# Patient Record
Sex: Male | Born: 1985 | Race: Black or African American | Hispanic: No | Marital: Single | Smoking: Current some day smoker
Health system: Southern US, Community
[De-identification: ages and names within clinical notes are randomized; demographics above are authoritative.]

## PROBLEM LIST (undated history)

## (undated) DIAGNOSIS — IMO0001 Reserved for inherently not codable concepts without codable children: Secondary | ICD-10-CM

## (undated) DIAGNOSIS — I10 Essential (primary) hypertension: Secondary | ICD-10-CM

## (undated) HISTORY — PX: ABDOMINAL SURGERY: SHX537

---
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
## 2004-04-19 ENCOUNTER — Emergency Department (HOSPITAL_COMMUNITY): Admission: EM | Admit: 2004-04-19 | Discharge: 2004-04-19 | Payer: Self-pay | Admitting: Emergency Medicine

## 2006-08-13 ENCOUNTER — Emergency Department (HOSPITAL_COMMUNITY): Admission: EM | Admit: 2006-08-13 | Discharge: 2006-08-13 | Payer: Self-pay | Admitting: Family Medicine

## 2008-07-27 ENCOUNTER — Emergency Department (HOSPITAL_COMMUNITY): Admission: EM | Admit: 2008-07-27 | Discharge: 2008-07-27 | Payer: Self-pay | Admitting: Family Medicine

## 2010-06-08 ENCOUNTER — Emergency Department (HOSPITAL_COMMUNITY)
Admission: EM | Admit: 2010-06-08 | Discharge: 2010-06-08 | Disposition: A | Discharge status: Home / Self Care | Payer: Self-pay | Attending: Emergency Medicine | Admitting: Emergency Medicine

## 2010-06-08 DIAGNOSIS — M545 Low back pain, unspecified: Secondary | ICD-10-CM | POA: Insufficient documentation

## 2010-07-07 ENCOUNTER — Emergency Department (HOSPITAL_COMMUNITY)
Admission: EM | Admit: 2010-07-07 | Discharge: 2010-07-08 | Disposition: A | Discharge status: Home / Self Care | Payer: Self-pay | Attending: Emergency Medicine | Admitting: Emergency Medicine

## 2010-07-07 DIAGNOSIS — J351 Hypertrophy of tonsils: Secondary | ICD-10-CM | POA: Insufficient documentation

## 2010-07-07 DIAGNOSIS — J029 Acute pharyngitis, unspecified: Secondary | ICD-10-CM | POA: Insufficient documentation

## 2010-07-07 LAB — RAPID STREP SCREEN (MED CTR MEBANE ONLY): Streptococcus, Group A Screen (Direct): NEGATIVE

## 2010-08-31 NOTE — Op Note (Signed)
. [facility]  Patient:    Hunter Koch, Hunter Koch Visit Number: [id number] MRN: [id number]          Service Type: DSU Location: [facility] Attending Physician:  Milly Jakob Proc. Date: 01/14/01 Admit Date:  01/14/2001                             Operative Report  PREOPERATIVE DIAGNOSIS:  Fracture of scaphoid.  POSTOPERATIVE DIAGNOSIS:  Fracture of scaphoid.  PROCEDURE:  Reduction and fixation of scaphoid fracture with a 20 mm 3 standard Accutrak screw.  SURGEON:  Harvie Junior, M.D.  ASSISTANT:  Currie Paris. Thedore Mins.  ANESTHESIA:  General anesthesia.  INDICATIONS:  This is a 25 year old male with a long history of having a fracture of the scaphoid which happened about three weeks ago.  He ultimately presented complaining of wrist pain.  X-ray showed an obvious fracture.  An MRI was obtained at the familys request, ultimately to sort out whether this could possibly be an old fibrous union or cystic change and turned out to be an acute fracture of the scaphoid.  The patient was desirous of being able to play football, but ultimately felt that the pain would be best controlled with internal fixation.  He was brought to the operating room for this procedure.  DESCRIPTION OF PROCEDURE:  The patient was brought to the operating room and after adequate anesthesia was obtained with general anesthesia, the patient was placed on the operating table.  The right arm was then prepped and draped in the usual sterile fashion.  Following this, the arm was pronated and flexed. The cortical ring was identified and a guide wire was advanced across the midportion of the scaphoid in both the AP and lateral.  The guide wire advanced out through the trapezium and was held in place while the imaging was taken, AP, lateral, obliques to show that the guide wire was perpendicular to the fracture site and the articular margin of the scaphoid.  At this point, the screw  was measured and a 20 mm Accutrak screw was chosen.  The fracture was reamed to a level of 22.5 mm and the 20 screw was advanced across the fracture site and buried within the scaphoid.  Imaging was used throughout this to ensure that the screw was in fact the correct length and it was.  The fracture gap did appear to close down and following placement of the screw under fluoroscopic imaging.  The wound was then copiously irrigated and suctioned dry.  The skin was closed with two interrupted nylon sutures.  A sterile and compressive dressing was applied and a thumb spica cast.  The patient was then taken to the recovery room where he was noted to be in satisfactory condition.  Estimated blood loss for the procedure was none. Attending Physician:  Milly Jakob DD:  01/14/01 TD:  01/14/01 Job: [id number]

## 2010-11-12 ENCOUNTER — Emergency Department (HOSPITAL_COMMUNITY): Payer: Self-pay

## 2010-11-12 ENCOUNTER — Emergency Department (HOSPITAL_COMMUNITY)
Admission: EM | Admit: 2010-11-12 | Discharge: 2010-11-12 | Disposition: A | Discharge status: Home / Self Care | Payer: Self-pay | Attending: Emergency Medicine | Admitting: Emergency Medicine

## 2010-11-12 DIAGNOSIS — S8990XA Unspecified injury of unspecified lower leg, initial encounter: Secondary | ICD-10-CM | POA: Insufficient documentation

## 2010-11-12 DIAGNOSIS — Y92009 Unspecified place in unspecified non-institutional (private) residence as the place of occurrence of the external cause: Secondary | ICD-10-CM | POA: Insufficient documentation

## 2010-11-12 DIAGNOSIS — S99929A Unspecified injury of unspecified foot, initial encounter: Secondary | ICD-10-CM | POA: Insufficient documentation

## 2010-11-12 DIAGNOSIS — M79609 Pain in unspecified limb: Secondary | ICD-10-CM | POA: Insufficient documentation

## 2010-11-12 DIAGNOSIS — S9030XA Contusion of unspecified foot, initial encounter: Secondary | ICD-10-CM | POA: Insufficient documentation

## 2010-11-12 DIAGNOSIS — IMO0002 Reserved for concepts with insufficient information to code with codable children: Secondary | ICD-10-CM | POA: Insufficient documentation

## 2010-11-12 DIAGNOSIS — M25579 Pain in unspecified ankle and joints of unspecified foot: Secondary | ICD-10-CM | POA: Insufficient documentation

## 2011-01-10 ENCOUNTER — Emergency Department (HOSPITAL_COMMUNITY)
Admission: EM | Admit: 2011-01-10 | Discharge: 2011-01-10 | Disposition: A | Discharge status: Home / Self Care | Payer: Self-pay | Attending: Emergency Medicine | Admitting: Emergency Medicine

## 2011-01-10 DIAGNOSIS — IMO0001 Reserved for inherently not codable concepts without codable children: Secondary | ICD-10-CM | POA: Insufficient documentation

## 2011-01-10 DIAGNOSIS — L0231 Cutaneous abscess of buttock: Secondary | ICD-10-CM | POA: Insufficient documentation

## 2011-01-10 DIAGNOSIS — L03317 Cellulitis of buttock: Secondary | ICD-10-CM | POA: Insufficient documentation

## 2011-03-04 ENCOUNTER — Emergency Department (HOSPITAL_COMMUNITY)
Admission: EM | Admit: 2011-03-04 | Discharge: 2011-03-04 | Disposition: A | Discharge status: Home / Self Care | Payer: Self-pay | Attending: Emergency Medicine | Admitting: Emergency Medicine

## 2011-03-04 ENCOUNTER — Encounter: Payer: Self-pay | Admitting: *Deleted

## 2011-03-04 DIAGNOSIS — M5380 Other specified dorsopathies, site unspecified: Secondary | ICD-10-CM | POA: Insufficient documentation

## 2011-03-04 DIAGNOSIS — M545 Low back pain, unspecified: Secondary | ICD-10-CM | POA: Insufficient documentation

## 2011-03-04 DIAGNOSIS — M62830 Muscle spasm of back: Secondary | ICD-10-CM

## 2011-03-04 DIAGNOSIS — R1031 Right lower quadrant pain: Secondary | ICD-10-CM | POA: Insufficient documentation

## 2011-03-04 MED ORDER — METHOCARBAMOL 500 MG PO TABS: 500.0000 mg | ORAL_TABLET | Freq: Two times a day (BID) | ORAL | Status: AC | PRN

## 2011-03-04 MED ORDER — KETOROLAC TROMETHAMINE 60 MG/2ML IM SOLN
60.0000 mg | Freq: Once | INTRAMUSCULAR | Status: AC
  Administered 2011-03-04: 60 mg via INTRAMUSCULAR
  Filled 2011-03-04: qty 2

## 2011-03-04 MED ORDER — NAPROXEN 500 MG PO TABS: 500.0000 mg | ORAL_TABLET | Freq: Two times a day (BID) | ORAL | Status: DC

## 2011-03-04 MED ORDER — OXYCODONE-ACETAMINOPHEN 5-325 MG PO TABS
2.0000 | ORAL_TABLET | Freq: Once | ORAL | Status: AC
  Administered 2011-03-04: 2 via ORAL
  Filled 2011-03-04: qty 2

## 2011-03-04 MED ORDER — OXYCODONE-ACETAMINOPHEN 5-325 MG PO TABS: 1.0000 | ORAL_TABLET | ORAL | Status: AC | PRN

## 2011-03-04 NOTE — ED Provider Notes (Signed)
History     CSN: [id number] Arrival date & time: 03/04/2011  1:08 AM   First MD Initiated Contact with Patient 03/04/11 0109      Chief Complaint  Patient presents with  . Back Pain    muscle spasms    (Consider location/radiation/quality/duration/timing/severity/associated sxs/prior treatment) HPI Comments: Acute onset of lower back pain while getting out of bed. No history of urinary symptoms and no red flags for pathologic back pain  Patient is a 25 y.o. male presenting with back pain. The history is provided by the patient.  Back Pain  This is a new problem. The current episode started 1 to 2 hours ago. The problem occurs constantly. The problem has not changed since onset.Associated with: Getting out of bed. Pain location: Right lower back  The quality of the pain is described as aching and cramping. Radiates to: Right lower abdomen. The pain is at a severity of 10/10. The pain is severe. The symptoms are aggravated by bending, twisting and certain positions. Pertinent negatives include no chest pain, no numbness, no headaches, no bowel incontinence, no perianal numbness, no bladder incontinence, no dysuria, no leg pain, no paresthesias, no paresis, no tingling and no weakness. He has tried nothing for the symptoms.    History reviewed. No pertinent past medical history.  Past Surgical History  Procedure Date  . Abdominal surgery     Family History  Problem Relation Age of Onset  . Hypertension Mother   . Diabetes Father     History  Substance Use Topics  . Smoking status: Current Everyday Smoker  . Smokeless tobacco: Not on file  . Alcohol Use: Yes     occaisional      Review of Systems  Cardiovascular: Negative for chest pain.  Gastrointestinal: Negative for bowel incontinence.  Genitourinary: Negative for bladder incontinence and dysuria.  Musculoskeletal: Positive for back pain.  Neurological: Negative for tingling, weakness, numbness, headaches and  paresthesias.    Allergies  Review of patient's allergies indicates no known allergies.  Home Medications   Current Outpatient Rx  Name Route Sig Dispense Refill  . METHOCARBAMOL 500 MG PO TABS Oral Take 1 tablet (500 mg total) by mouth 2 (two) times daily as needed. 20 tablet 0  . NAPROXEN 500 MG PO TABS Oral Take 1 tablet (500 mg total) by mouth 2 (two) times daily. 30 tablet 0  . OXYCODONE-ACETAMINOPHEN 5-325 MG PO TABS Oral Take 1 tablet by mouth every 4 (four) hours as needed for pain. May take 2 tablets PO q 6 hours for severe pain - Do not take with Tylenol as this tablet already contains tylenol 15 tablet 0    BP 147/88  Pulse 78  Temp 98.3 F (36.8 C)  Resp 20  SpO2 100%  Physical Exam  Constitutional: He appears well-developed and well-nourished.       Uncomfortable appearing  HENT:  Head: Normocephalic and atraumatic.  Eyes: Conjunctivae are normal. No scleral icterus.  Cardiovascular: Normal rate, regular rhythm and normal heart sounds.   Pulmonary/Chest: Effort normal and breath sounds normal.  Abdominal: Soft. Bowel sounds are normal. He exhibits no distension. There is tenderness ( Tenderness to palpation with deep paracolic gutter palpation). There is no rebound and no guarding.       Abdomen abdomen is slightly palpated there is no tenderness  Musculoskeletal: Normal range of motion. He exhibits tenderness ( No tenderness to palpation of the back including paraspinal muscles and spinal bones.). He exhibits no edema.  Tenderness with psoas stretching  Neurological: He is alert. Coordination normal.       Normal sensation and reflexes of the bilateral lower extremities at the patellar tendon  Skin: Skin is warm and dry. No rash noted.    ED Course  Procedures (including critical care time)  Labs Reviewed - No data to display No results found.   1. Muscle spasm of back       MDM  Patient is able to walk but gait is hindered by back pain. Given  the reproducible nature of his pain with stretching of his toe as an iliopsoas muscles, suspect this is muscle spasm. There is no spinal tenderness and no focal neurologic deficits to suspect spinal cord pathology. There are no urinary symptoms or fevers to suggest urinary pathology. There is no CVA tenderness. Patient has been given Toradol intramuscular and Percocet in the emergency department with some improvement.        Vida Roller, MD 03/04/11 [phone]

## 2011-03-04 NOTE — ED Notes (Signed)
C/o back pain & muscle spasms, onset tonight, pt straight back to Room 9, Dr. Hyacinth Meeker into room during triage process. Alert, NAD, calm, interactive, skin W&D, resps e/u. also mentions diarrhea (loose & watery), also R leg numb, back worse with leg movement. Tender to RLQ. Denies other sx.

## 2011-03-04 NOTE — ED Notes (Signed)
EDP at [facility], pt seen, assessed prior to RN assessment, see MD notes, pending orders.

## 2011-03-20 ENCOUNTER — Encounter (HOSPITAL_COMMUNITY): Payer: Self-pay | Admitting: Emergency Medicine

## 2011-03-20 ENCOUNTER — Emergency Department (HOSPITAL_COMMUNITY)
Admission: EM | Admit: 2011-03-20 | Discharge: 2011-03-20 | Disposition: A | Discharge status: Home / Self Care | Payer: Self-pay | Attending: Emergency Medicine | Admitting: Emergency Medicine

## 2011-03-20 DIAGNOSIS — K089 Disorder of teeth and supporting structures, unspecified: Secondary | ICD-10-CM | POA: Insufficient documentation

## 2011-03-20 DIAGNOSIS — R220 Localized swelling, mass and lump, head: Secondary | ICD-10-CM | POA: Insufficient documentation

## 2011-03-20 DIAGNOSIS — K029 Dental caries, unspecified: Secondary | ICD-10-CM | POA: Insufficient documentation

## 2011-03-20 DIAGNOSIS — R110 Nausea: Secondary | ICD-10-CM | POA: Insufficient documentation

## 2011-03-20 DIAGNOSIS — K0889 Other specified disorders of teeth and supporting structures: Secondary | ICD-10-CM

## 2011-03-20 MED ORDER — PENICILLIN V POTASSIUM 500 MG PO TABS: 500.0000 mg | ORAL_TABLET | Freq: Four times a day (QID) | ORAL | Status: AC

## 2011-03-20 MED ORDER — NAPROXEN 500 MG PO TABS: 500.0000 mg | ORAL_TABLET | Freq: Two times a day (BID) | ORAL | Status: DC

## 2011-03-20 MED ORDER — OXYCODONE-ACETAMINOPHEN 5-325 MG PO TABS: 1.0000 | ORAL_TABLET | ORAL | Status: AC | PRN

## 2011-03-20 MED ORDER — KETOROLAC TROMETHAMINE 60 MG/2ML IM SOLN
60.0000 mg | Freq: Once | INTRAMUSCULAR | Status: AC
  Administered 2011-03-20: 60 mg via INTRAMUSCULAR
  Filled 2011-03-20: qty 2

## 2011-03-20 NOTE — ED Notes (Signed)
PT. REPORTS RIGHT LOWER MOLAR PAIN ONSET LAST NIGHT UNRELIEVED BY OTC PAIN MEDICATIONS.

## 2011-03-20 NOTE — ED Notes (Signed)
Pt states that the bottom right side tooth is hurting pt states no denist. Pt has no absess but a cavity in the tooth that hurts. Pt painful to touch.

## 2011-03-20 NOTE — ED Provider Notes (Signed)
History     CSN: [id number] Arrival date & time: 03/20/2011  6:33 AM   First MD Initiated Contact with Patient 03/20/11 [phone]      Chief Complaint  Patient presents with  . Dental Pain    (Consider location/radiation/quality/duration/timing/severity/associated sxs/prior treatment) HPI Comments: Right lower first molar pain, onset last night, associated with mild swelling and nausea. Symptoms are constant, worse with chewing. Denies acute injury but has history of cavity and said tooth. Has seen dentists in the past but not recently and has not been seen for this complaint prior to this visit. No pain medications prior to arrival  Patient is a 25 y.o. male presenting with tooth pain. The history is provided by the patient.  Dental PainPrimary symptoms do not include fever or sore throat.  Additional symptoms do not include: facial swelling and trouble swallowing.    History reviewed. No pertinent past medical history.  Past Surgical History  Procedure Date  . Abdominal surgery     Family History  Problem Relation Age of Onset  . Hypertension Mother   . Diabetes Father     History  Substance Use Topics  . Smoking status: Current Everyday Smoker  . Smokeless tobacco: Not on file  . Alcohol Use: Yes     occaisional      Review of Systems  Constitutional: Negative for fever and chills.  HENT: Positive for dental problem. Negative for sore throat, facial swelling, trouble swallowing and voice change.        Toothache  Gastrointestinal: Positive for nausea. Negative for vomiting.    Allergies  Review of patient's allergies indicates no known allergies.  Home Medications   Current Outpatient Rx  Name Route Sig Dispense Refill  . NAPROXEN 500 MG PO TABS Oral Take 1 tablet (500 mg total) by mouth 2 (two) times daily. 30 tablet 0  . NAPROXEN 500 MG PO TABS Oral Take 1 tablet (500 mg total) by mouth 2 (two) times daily with a meal. 30 tablet 0  . OXYCODONE-ACETAMINOPHEN  5-325 MG PO TABS Oral Take 1 tablet by mouth every 4 (four) hours as needed for pain. May take 2 tablets PO q 6 hours for severe pain - Do not take with Tylenol as this tablet already contains tylenol 15 tablet 0  . PENICILLIN V POTASSIUM 500 MG PO TABS Oral Take 1 tablet (500 mg total) by mouth 4 (four) times daily. 40 tablet 0    BP 120/80  Pulse 117  Temp(Src) 99.3 F (37.4 C) (Oral)  Resp 18  SpO2 92%  Physical Exam  Nursing note and vitals reviewed. Constitutional: He appears well-developed and well-nourished. No distress.  HENT:  Head: Normocephalic and atraumatic.  Mouth/Throat: Oropharynx is clear and moist. No oropharyngeal exudate.       Dental Disease - has deep caries in right first lower molar, no surrounding abscess is, no oral cavity swelling and no pain or swelling under the tongue in the sublingual area.  Eyes: Conjunctivae are normal. No scleral icterus.  Neck: Normal range of motion. Neck supple. No thyromegaly present.       No submental or sublingual or anterior cervical lymphadenopathy  Cardiovascular: Normal rate and regular rhythm.   Pulmonary/Chest: Effort normal and breath sounds normal.  Lymphadenopathy:    He has no cervical adenopathy.  Neurological: He is alert.  Skin: Skin is warm and dry. No rash noted. He is not diaphoretic.    ED Course  Procedures (including critical care time)  Labs Reviewed - No data to display No results found.   1. Toothache       MDM  Tenderness and pain over tooth, and deep cavity consistent with likely periapical abscess, antibiotics, pain medications for home. Dental followup suggested and followup information given on discharge paperwork. Patient aware of need to call today for appointment       Vida Roller, MD 03/20/11 [phone]

## 2011-09-17 ENCOUNTER — Encounter (HOSPITAL_COMMUNITY): Payer: Self-pay | Admitting: Emergency Medicine

## 2011-09-17 ENCOUNTER — Emergency Department (HOSPITAL_COMMUNITY)
Admission: EM | Admit: 2011-09-17 | Discharge: 2011-09-17 | Disposition: A | Discharge status: Home / Self Care | Payer: Self-pay | Attending: Emergency Medicine | Admitting: Emergency Medicine

## 2011-09-17 DIAGNOSIS — R197 Diarrhea, unspecified: Secondary | ICD-10-CM | POA: Insufficient documentation

## 2011-09-17 DIAGNOSIS — IMO0001 Reserved for inherently not codable concepts without codable children: Secondary | ICD-10-CM | POA: Insufficient documentation

## 2011-09-17 DIAGNOSIS — R112 Nausea with vomiting, unspecified: Secondary | ICD-10-CM | POA: Insufficient documentation

## 2011-09-17 DIAGNOSIS — R109 Unspecified abdominal pain: Secondary | ICD-10-CM | POA: Insufficient documentation

## 2011-09-17 DIAGNOSIS — R198 Other specified symptoms and signs involving the digestive system and abdomen: Secondary | ICD-10-CM | POA: Insufficient documentation

## 2011-09-17 HISTORY — DX: Reserved for inherently not codable concepts without codable children: IMO0001

## 2011-09-17 LAB — COMPREHENSIVE METABOLIC PANEL
ALT: 14 U/L (ref 0–53)
BUN: 9 mg/dL (ref 6–23)
CO2: 22 mEq/L (ref 19–32)
Calcium: 9.7 mg/dL (ref 8.4–10.5)
GFR calc Af Amer: >90 mL/min (ref >90)
GFR calc non Af Amer: >90 mL/min (ref >90)
Glucose, Bld: 121 mg/dL — ABNORMAL HIGH (ref 70–99)
Sodium: 139 mEq/L (ref 135–145)
Total Protein: 7.8 g/dL (ref 6.0–8.3)

## 2011-09-17 LAB — POCT I-STAT, CHEM 8
Calcium, Ion: 1.15 mmol/L (ref 1.12–1.32)
Chloride: 104 mEq/L (ref 96–112)
HCT: 48 % (ref 39.0–52.0)
Sodium: 142 mEq/L (ref 135–145)
TCO2: 22 mmol/L (ref 0–100)

## 2011-09-17 LAB — CBC
HCT: 43 % (ref 39.0–52.0)
Hemoglobin: 14.6 g/dL (ref 13.0–17.0)
MCH: 28.5 pg (ref 26.0–34.0)
MCHC: 34 g/dL (ref 30.0–36.0)
MCV: 83.8 fL (ref 78.0–100.0)
RBC: 5.13 MIL/uL (ref 4.22–5.81)

## 2011-09-17 MED ORDER — ONDANSETRON HCL 4 MG/2ML IJ SOLN
4.0000 mg | Freq: Once | INTRAMUSCULAR | Status: AC
  Administered 2011-09-17: 4 mg via INTRAVENOUS
  Filled 2011-09-17: qty 2

## 2011-09-17 MED ORDER — PROMETHAZINE HCL 25 MG PO TABS: 25.0000 mg | ORAL_TABLET | Freq: Four times a day (QID) | ORAL | Status: DC | PRN

## 2011-09-17 MED ORDER — SODIUM CHLORIDE 0.9 % IV BOLUS (SEPSIS)
1000.0000 mL | Freq: Once | INTRAVENOUS | Status: AC
  Administered 2011-09-17: 1000 mL via INTRAVENOUS

## 2011-09-17 MED ORDER — HYDROMORPHONE HCL PF 1 MG/ML IJ SOLN
1.0000 mg | Freq: Once | INTRAMUSCULAR | Status: AC
  Administered 2011-09-17: 1 mg via INTRAVENOUS
  Filled 2011-09-17: qty 1

## 2011-09-17 NOTE — ED Provider Notes (Signed)
History     CSN: [id number]  Arrival date & time 09/17/11  0204   First MD Initiated Contact with Patient 09/17/11 0226      Chief Complaint  Patient presents with  . Emesis  . Diarrhea    (Consider location/radiation/quality/duration/timing/severity/associated sxs/prior treatment) HPI History provided by patient. Lower abdominal cramping started tonight. Patient states he ate breakfast at [employer] and had an egg sandwich. He was belching all day long, and that it tasted like sour eggs. Then tonight developed symptoms. Having cramping and tenesmus. No stools. Nausea all day and vomiting on arrival to the ED. On off chills but no recorded fevers. No known sick contacts. No travel. No recent antibiotics. No difficulty urinating. Moderate in severity. No medical problems or history of similar symptoms. Past Medical History  Diagnosis Date  . No significant past medical history     Past Surgical History  Procedure Date  . Abdominal surgery     Family History  Problem Relation Age of Onset  . Hypertension Mother   . Diabetes Father     History  Substance Use Topics  . Smoking status: Current Everyday Smoker  . Smokeless tobacco: Not on file  . Alcohol Use: Yes     occaisional      Review of Systems  Constitutional: Negative for fever and chills.  HENT: Negative for neck pain and neck stiffness.   Eyes: Negative for pain.  Respiratory: Negative for shortness of breath.   Cardiovascular: Negative for chest pain.  Gastrointestinal: Positive for nausea and vomiting. Negative for constipation, blood in stool and abdominal distention.  Genitourinary: Negative for dysuria.  Musculoskeletal: Negative for back pain.  Skin: Negative for rash.  Neurological: Negative for headaches.  All other systems reviewed and are negative.    Allergies  Review of patient's allergies indicates no known allergies.  Home Medications  No current outpatient prescriptions on file.  BP  140/122  Pulse 74  Resp 18  SpO2 98%  Physical Exam  Constitutional: He is oriented to person, place, and time. He appears well-developed and well-nourished.  HENT:  Head: Normocephalic and atraumatic.       Mildly dry mucous membranes  Eyes: Conjunctivae and EOM are normal. Pupils are equal, round, and reactive to light.  Neck: Trachea normal. Neck supple. No thyromegaly present.  Cardiovascular: Normal rate, regular rhythm, S1 normal, S2 normal and normal pulses.     No systolic murmur is present   No diastolic murmur is present  Pulses:      Radial pulses are 2+ on the right side, and 2+ on the left side.  Pulmonary/Chest: Effort normal and breath sounds normal. He has no wheezes. He has no rhonchi. He has no rales. He exhibits no tenderness.  Abdominal: Soft. Normal appearance. There is no CVA tenderness and negative Murphy's sign.       Hyperactive bowel sounds without tenderness. Soft throughout localizes discomfort to left lower quadrant region.  Musculoskeletal:       BLE:s Calves nontender, no cords or erythema, negative Homans sign  Neurological: He is alert and oriented to person, place, and time. He has normal strength. No cranial nerve deficit or sensory deficit. GCS eye subscore is 4. GCS verbal subscore is 5. GCS motor subscore is 6.  Skin: Skin is warm and dry. No rash noted. He is not diaphoretic.  Psychiatric: His speech is normal.       Cooperative and appropriate    ED Course  Procedures (including  critical care time)  IV fluids. Zofran and Dilaudid. Serial exams without tenderness or peritonitis.  Results for orders placed during the hospital encounter of 09/17/11  CBC      Component Value Range   WBC 14.7 (*) 4.0 - 10.5 (K/uL)   RBC 5.13  4.22 - 5.81 (MIL/uL)   Hemoglobin 14.6  13.0 - 17.0 (g/dL)   HCT 95.2  84.1 - 32.4 (%)   MCV 83.8  78.0 - 100.0 (fL)   MCH 28.5  26.0 - 34.0 (pg)   MCHC 34.0  30.0 - 36.0 (g/dL)   RDW 40.1  02.7 - 25.3 (%)    Platelets 253  150 - 400 (K/uL)  COMPREHENSIVE METABOLIC PANEL      Component Value Range   Sodium 139  135 - 145 (mEq/L)   Potassium 3.4 (*) 3.5 - 5.1 (mEq/L)   Chloride 103  96 - 112 (mEq/L)   CO2 22  19 - 32 (mEq/L)   Glucose, Bld 121 (*) 70 - 99 (mg/dL)   BUN 9  6 - 23 (mg/dL)   Creatinine, Ser 6.64  0.50 - 1.35 (mg/dL)   Calcium 9.7  8.4 - 40.3 (mg/dL)   Total Protein 7.8  6.0 - 8.3 (g/dL)   Albumin 4.4  3.5 - 5.2 (g/dL)   AST 15  0 - 37 (U/L)   ALT 14  0 - 53 (U/L)   Alkaline Phosphatase 61  39 - 117 (U/L)   Total Bilirubin 0.8  0.3 - 1.2 (mg/dL)   GFR calc non Af Amer >90  >90 (mL/min)   GFR calc Af Amer >90  >90 (mL/min)  POCT I-STAT, CHEM 8      Component Value Range   Sodium 142  135 - 145 (mEq/L)   Potassium 3.3 (*) 3.5 - 5.1 (mEq/L)   Chloride 104  96 - 112 (mEq/L)   BUN 8  6 - 23 (mg/dL)   Creatinine, Ser 4.74  0.50 - 1.35 (mg/dL)   Glucose, Bld 259 (*) 70 - 99 (mg/dL)   Calcium, Ion 5.63  8.75 - 1.32 (mmol/L)   TCO2 22  0 - 100 (mmol/L)   Hemoglobin 16.3  13.0 - 17.0 (g/dL)   HCT 64.3  32.9 - 51.8 (%)   Labs obtained and reviewed as above. Recheck at 5:10 AM is feeling much better and requesting to be discharged home. Repeat ABD exam unchanged.    MDM   Nausea and vomiting with lower abdominal cramping and workup as above. Symptoms significantly improved and patient requesting discharge home. No acute abdomen or indication for CT scan at this time. Reliable historian verbalizes understanding abdominal pain precautions and agrees to all discharge and followup instructions.        Sunnie Nielsen, MD 09/17/11 [phone]

## 2011-09-17 NOTE — ED Notes (Signed)
Pt denies any pain or questions upon discharge, verbalizes understanding of medications given with no driving and prescription medications with f/u instructions.

## 2011-09-17 NOTE — ED Notes (Signed)
Attempted to establish iv for fluids ordered by MD. Plan of care was updated and pt refused iv, state "no i don't do needles, i just want something to drink". MD will be made aware and to follow up, will continue to monitor pt.

## 2011-09-17 NOTE — ED Notes (Signed)
Patient complaining of lower abdominal pain, nausea, diarrhea, fever, chills, and dizziness that started today.  8 episodes of emesis in last 24 hours.

## 2011-09-17 NOTE — Discharge Instructions (Signed)
Clear Liquid Diet The clear liquid dietconsists of foods that are liquid or will become liquid at room temperature.You should be able to see through the liquid and beverages. Examples of foods allowed on a clear liquid diet include fruit juice, broth or bouillon, gelatin, or frozen ice pops. The purpose of this diet is to provide necessary fluid, electrolytes such as sodium and potassium, and energy to keep the body functioning during times when you are not able to consume a regular diet.A clear liquid diet should not be continued for long periods of time as it is not nutritionally adequate.  REASONS FOR USING A CLEAR LIQUID DIET  In sudden onset (acute) conditions for a patient before or after surgery.   As the first step in oral feeding.   For fluid and electrolyte replacement in diarrheal diseases.   As a diet before certain medical tests are performed.  ADEQUACY The clear liquid diet is adequate only in ascorbic acid, according to the Recommended Dietary Allowances of the National Research Council. CHOOSING FOODS Breads and Starches  Allowed:  None are allowed.   Avoid: All are avoided.  Vegetables  Allowed:  Strained tomato or vegetable juice.   Avoid: Any others.  Fruit  Allowed:  Strained fruit juices and fruit drinks. Include 1 serving of citrus or vitamin C-enriched fruit juice daily.   Avoid: Any others.  Meat and Meat Substitutes  Allowed:  None are allowed.   Avoid: All are avoided.  Milk  Allowed:  None are allowed.   Avoid: All are avoided.  Soups and Combination Foods  Allowed:  Clear bouillon, broth, or strained broth-based soups.   Avoid: Any others.  Desserts and Sweets  Allowed:  Sugar, honey. High protein gelatin. Flavored gelatin, ices, or frozen ice pops that do not contain milk.   Avoid: Any others.  Fats and Oils  Allowed:  None are allowed.   Avoid: All are avoided.  Beverages  Allowed: Cereal beverages, coffee (regular or  decaffeinated), tea, or soda at the discretion of your caregiver.   Avoid: Any others.  Condiments  Allowed:  Iodized salt.   Avoid: Any others, including pepper.  Supplements  Allowed:  Liquid nutrition beverages.   Avoid: Any others that contain lactose or fiber.  SAMPLE MEAL PLAN Breakfast  4 oz (120 mL) strained orange juice.    to 1 cup (125 to 250 mL) gelatin (plain or fortified).   1 cup (250 mL) beverage (coffee or tea).   Sugar, if desired.  Midmorning Snack   cup (125 mL) gelatin (plain or fortified).  Lunch  1 cup (250 mL) broth or consomm.   4 oz (120 mL) strained grapefruit juice.    cup (125 mL) gelatin (plain or fortified).   1 cup (250 mL) beverage (coffee or tea).   Sugar, if desired.  Midafternoon Snack   cup (125 mL) fruit ice.    cup (125 mL) strained fruit juice.  Dinner  1 cup (250 mL) broth or consomm.    cup (125 mL) cranberry juice.    cup (125 mL) flavored gelatin (plain or fortified).   1 cup (250 mL) beverage (coffee or tea).   Sugar, if desired.  Evening Snack  4 oz (120 mL) strained apple juice (vitamin C-fortified).    cup (125 mL) flavored gelatin (plain or fortified).  Document Released: 04/01/2005 Document Revised: 03/21/2011 Document Reviewed: 06/29/2010 [employer] Patient Information 2012 [employer]. 

## 2011-09-17 NOTE — ED Notes (Signed)
MD at bedside for pt evaluation

## 2012-03-10 ENCOUNTER — Other Ambulatory Visit: Payer: Self-pay | Admitting: Occupational Medicine

## 2012-03-10 ENCOUNTER — Ambulatory Visit: Payer: Self-pay

## 2012-03-10 DIAGNOSIS — Z0000 Encounter for general adult medical examination without abnormal findings: Secondary | ICD-10-CM

## 2013-01-07 ENCOUNTER — Encounter (HOSPITAL_COMMUNITY): Payer: Self-pay | Admitting: *Deleted

## 2013-01-07 ENCOUNTER — Emergency Department (HOSPITAL_COMMUNITY)
Admission: EM | Admit: 2013-01-07 | Discharge: 2013-01-07 | Disposition: A | Discharge status: Home / Self Care | Payer: BC Managed Care – PPO | Attending: Emergency Medicine | Admitting: Emergency Medicine

## 2013-01-07 DIAGNOSIS — Z77098 Contact with and (suspected) exposure to other hazardous, chiefly nonmedicinal, chemicals: Secondary | ICD-10-CM | POA: Insufficient documentation

## 2013-01-07 DIAGNOSIS — T2692XA Corrosion of left eye and adnexa, part unspecified, initial encounter: Secondary | ICD-10-CM

## 2013-01-07 DIAGNOSIS — H5789 Other specified disorders of eye and adnexa: Secondary | ICD-10-CM | POA: Insufficient documentation

## 2013-01-07 DIAGNOSIS — H53149 Visual discomfort, unspecified: Secondary | ICD-10-CM | POA: Insufficient documentation

## 2013-01-07 DIAGNOSIS — F17200 Nicotine dependence, unspecified, uncomplicated: Secondary | ICD-10-CM | POA: Insufficient documentation

## 2013-01-07 MED ORDER — TETRACAINE HCL 0.5 % OP SOLN
2.0000 [drp] | Freq: Once | OPHTHALMIC | Status: AC
  Administered 2013-01-07: 2 [drp] via OPHTHALMIC
  Filled 2013-01-07: qty 2

## 2013-01-07 MED ORDER — TOBRAMYCIN 0.3 % OP SOLN: 1.0000 [drp] | OPHTHALMIC | Status: DC

## 2013-01-07 MED ORDER — FLUORESCEIN SODIUM 1 MG OP STRP
1.0000 | ORAL_STRIP | Freq: Once | OPHTHALMIC | Status: AC
  Administered 2013-01-07: 1 via OPHTHALMIC

## 2013-01-07 MED ORDER — FLUORESCEIN SODIUM 1 MG OP STRP
1.0000 | ORAL_STRIP | Freq: Once | OPHTHALMIC | Status: DC
  Filled 2013-01-07: qty 1

## 2013-01-07 MED ORDER — OXYCODONE-ACETAMINOPHEN 5-325 MG PO TABS
2.0000 | ORAL_TABLET | Freq: Once | ORAL | Status: AC
  Administered 2013-01-07: 2 via ORAL
  Filled 2013-01-07: qty 2

## 2013-01-07 NOTE — ED Notes (Signed)
Pt works as a Forensic scientist. Pt was removing a hose and the built up pressure was significant and HTZ entered the patient's Lt eye. Chemical is liquid but crystallizes when it dries. Pt reports that he was wearing safety googles and he did attempt to flush eye PTA
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
## 2013-01-07 NOTE — ED Notes (Signed)
Eye Irrigation completed, pt tolerated procedure well--- pt denies eye pain at this time.

## 2013-01-07 NOTE — ED Provider Notes (Signed)
Medical screening examination/treatment/procedure(s) were performed by non-physician practitioner and as supervising physician I was immediately available for consultation/collaboration.  Juquan Reznick, MD 01/07/13 0618 

## 2013-07-01 ENCOUNTER — Encounter (HOSPITAL_COMMUNITY): Payer: Self-pay | Admitting: Emergency Medicine

## 2013-07-01 ENCOUNTER — Emergency Department (HOSPITAL_COMMUNITY)
Admission: EM | Admit: 2013-07-01 | Discharge: 2013-07-01 | Disposition: A | Discharge status: Home / Self Care | Payer: BC Managed Care – PPO | Attending: Emergency Medicine | Admitting: Emergency Medicine

## 2013-07-01 DIAGNOSIS — L02818 Cutaneous abscess of other sites: Secondary | ICD-10-CM | POA: Insufficient documentation

## 2013-07-01 DIAGNOSIS — L03818 Cellulitis of other sites: Principal | ICD-10-CM

## 2013-07-01 DIAGNOSIS — L0390 Cellulitis, unspecified: Secondary | ICD-10-CM

## 2013-07-01 DIAGNOSIS — F17200 Nicotine dependence, unspecified, uncomplicated: Secondary | ICD-10-CM | POA: Insufficient documentation

## 2013-07-01 MED ORDER — SULFAMETHOXAZOLE-TRIMETHOPRIM 800-160 MG PO TABS: 1.0000 | ORAL_TABLET | Freq: Two times a day (BID) | ORAL | Status: DC

## 2013-07-01 MED ORDER — CEPHALEXIN 500 MG PO CAPS: ORAL_CAPSULE | ORAL | Status: DC

## 2013-07-01 NOTE — ED Notes (Signed)
Pt states he has a rash on the right side of his head that has made his head feel like it was on fire  Pt has multiple raised areas noted  Pt states it started about a week ago

## 2013-07-01 NOTE — ED Notes (Signed)
Pt presents with raised bumps to lower posterior R side of head. Pt sts he has had the rash for about a week but it didn't start burning until today.

## 2013-07-01 NOTE — Discharge Instructions (Signed)
Cellulitis °Cellulitis is an infection of the skin and the tissue under the skin. The infected area is usually red and tender. This happens most often in the arms and lower legs. °HOME CARE  °· Take your antibiotic medicine as told. Finish the medicine even if you start to feel better. °· Keep the infected arm or leg raised (elevated). °· Put a warm cloth on the area up to 4 times per day. °· Only take medicines as told by your doctor. °· Keep all doctor visits as told. °GET HELP RIGHT AWAY IF:  °· You have a fever. °· You feel very sleepy. °· You throw up (vomit) or have watery poop (diarrhea). °· You feel sick and have muscle aches and pains. °· You see red streaks on the skin coming from the infected area. °· Your red area gets bigger or turns a dark color. °· Your bone or joint under the infected area is painful after the skin heals. °· Your infection comes back in the same area or different area. °· You have a puffy (swollen) bump in the infected area. °· You have new symptoms. °MAKE SURE YOU:  °· Understand these instructions. °· Will watch your condition. °· Will get help right away if you are not doing well or get worse. °Document Released: 09/18/2007 Document Revised: 10/01/2011 Document Reviewed: 06/17/2011 °ExitCare® Patient Information ©2014 [employer]. ° °

## 2013-07-01 NOTE — ED Provider Notes (Signed)
CSN: [id number]     Arrival date & time 07/01/13  0022 History   First MD Initiated Contact with Patient 07/01/13 0128     Chief Complaint  Patient presents with  . Rash     (Consider location/radiation/quality/duration/timing/severity/associated sxs/prior Treatment) HPI Comments: Patient is a 28 year old male who presents today with a rash to his right posterior scalp. He reports this has been developing over the past week, but tonight it was associated with pain. The pain began when he put his hardhat on at work. It is worse with palpation. He describes the pain as burning. He has never had anything like this in the past. He has not taken any medication to improve his symptoms. He does have history of abscess, normally on his buttocks. He states this does not feel like that. He denies any drainage from the area. No fevers, chills, nausea, vomiting, myalgias, diaphoresis.   Patient is a 28 y.o. male presenting with rash. The history is provided by the patient. No language interpreter was used.  Rash Associated symptoms: no abdominal pain, no diarrhea, no fever, no shortness of breath and not vomiting     Past Medical History  Diagnosis Date  . No significant past medical history    Past Surgical History  Procedure Laterality Date  . Abdominal surgery     Family History  Problem Relation Age of Onset  . Hypertension Mother   . Diabetes Father    History  Substance Use Topics  . Smoking status: Current Every Day Smoker -- 0.50 packs/day    Types: Cigarettes  . Smokeless tobacco: Not on file  . Alcohol Use: 4.2 oz/week    7 Cans of beer per week     Comment: often    Review of Systems  Constitutional: Negative for fever and chills.  Respiratory: Negative for shortness of breath.   Cardiovascular: Negative for chest pain.  Gastrointestinal: Negative for vomiting, abdominal pain and diarrhea.  Skin: Positive for rash.  All other systems reviewed and are  negative.      Allergies  Review of patient's allergies indicates no known allergies.  Home Medications   Current Outpatient Rx  Name  Route  Sig  Dispense  Refill  . pseudoephedrine (SINUS & ALLERGY 12 HOUR) 120 MG 12 hr tablet   Oral   Take 120 mg by mouth every 12 (twelve) hours as needed for congestion.         . cephALEXin (KEFLEX) 500 MG capsule      2 caps po bid x 7 days   28 capsule   0   . sulfamethoxazole-trimethoprim (SEPTRA DS) 800-160 MG per tablet   Oral   Take 1 tablet by mouth every 12 (twelve) hours.   20 tablet   0    BP 148/79  Pulse 70  Temp(Src) 98.2 F (36.8 C) (Oral)  Resp 14  Ht 6\' 2"  (1.88 m)  Wt 245 lb (111.131 kg)  BMI 31.44 kg/m2  SpO2 100% Physical Exam  Nursing note and vitals reviewed. Constitutional: He is oriented to person, place, and time. He appears well-developed and well-nourished.  Non-toxic appearance. He does not have a sickly appearance. He does not appear ill. No distress.  Patient is very well appearing.  HENT:  Head: Normocephalic and atraumatic.  Right Ear: External ear normal.  Left Ear: External ear normal.  Nose: Nose normal.  4 cm area of induration at right posterior scalp. No fluctuance. TTP. No active drainage  or erythema. US performed which shows no fluid collection.  No nuchal rigidity or meningeal signs.   Eyes: Conjunctivae are normal.  Neck: Normal range of motion. No tracheal deviation present.  Cardiovascular: Normal rate, regular rhythm and normal heart sounds.   Pulmonary/Chest: Effort normal and breath sounds normal. No stridor.  Abdominal: Soft. He exhibits no distension. There is no tenderness.  Musculoskeletal: Normal range of motion.  Neurological: He is alert and oriented to person, place, and time.  Skin: Skin is warm and dry. He is not diaphoretic.  Psychiatric: He has a normal mood and affect. His behavior is normal.    ED Course  Procedures (including critical care time) Labs  Review Labs Reviewed - No data to display Imaging Review No results found.   EKG Interpretation None      MDM   Final diagnoses:  Cellulitis   Patient presents to ED with cellulitis to posterior right scalp. US performed which shows no drainable abscess at this time. Patient is non toxic, non septic appearing. He was discharged with keflex and bactrim. Encouraged patient to return for recheck in 2 days to ensure there is no further abscess development. Discussed reasons to return to the emergency department immediately. Vital signs stable for discharge. Patient / Family / Caregiver informed of clinical course, understand medical decision-making process, and agree with plan.     Mora Bellman, PA-C 07/01/13 1328

## 2013-07-02 NOTE — ED Provider Notes (Signed)
Medical screening examination/treatment/procedure(s) were performed by non-physician practitioner and as supervising physician I was immediately available for consultation/collaboration.    Vida RollerBrian D Keltin Baird, MD 07/02/13 [phone]

## 2013-07-12 ENCOUNTER — Encounter (HOSPITAL_COMMUNITY): Payer: Self-pay | Admitting: Emergency Medicine

## 2013-07-12 ENCOUNTER — Emergency Department (HOSPITAL_COMMUNITY)
Admission: EM | Admit: 2013-07-12 | Discharge: 2013-07-12 | Disposition: A | Discharge status: Home / Self Care | Payer: BC Managed Care – PPO | Attending: Emergency Medicine | Admitting: Emergency Medicine

## 2013-07-12 DIAGNOSIS — Z792 Long term (current) use of antibiotics: Secondary | ICD-10-CM | POA: Insufficient documentation

## 2013-07-12 DIAGNOSIS — B350 Tinea barbae and tinea capitis: Secondary | ICD-10-CM | POA: Insufficient documentation

## 2013-07-12 DIAGNOSIS — F17200 Nicotine dependence, unspecified, uncomplicated: Secondary | ICD-10-CM | POA: Insufficient documentation

## 2013-07-12 MED ORDER — TERBINAFINE HCL 250 MG PO TABS: 250.0000 mg | ORAL_TABLET | Freq: Every day | ORAL | Status: DC

## 2013-07-12 MED ORDER — SELENIUM SULFIDE 2.5 % EX LOTN: 1.0000 "application " | TOPICAL_LOTION | Freq: Every day | CUTANEOUS | Status: DC

## 2013-07-12 NOTE — ED Notes (Signed)
Pt from home c/o an abcess located on the back of the head on the right side. It is painfull, swollen, and yellow drainage present. Pt was seen on 03/19 for same and was given PO abx since then pain and drainage has progressed.

## 2013-07-12 NOTE — Discharge Instructions (Signed)
Ringworm of the Scalp Tinea Capitis is also called scalp ringworm. It is a fungal infection of the skin on the scalp seen mainly in children.  CAUSES  Scalp ringworm spreads from:  Other people.  Pets (cats and dogs) and animals.  Bedding, hats, combs or brushes shared with an infected person  Theater seats that an infected person sat in. SYMPTOMS  Scalp ringworm causes the following symptoms:  Flaky scales that look like dandruff.  Circles of thick, raised red skin.  Hair loss.  Red pimples or pustules.  Swollen glands in the back of the neck.  Itching. DIAGNOSIS  A skin scraping or infected hairs will be sent to test for fungus. Testing can be done either by looking under the microscope (KOH examination) or by doing a culture (test to try to grow the fungus). A culture can take up to 2 weeks to come back. TREATMENT   Scalp ringworm must be treated with medicine by mouth to kill the fungus for 6 to 8 weeks.  Medicated shampoos (ketoconazole or selenium sulfide shampoo) may be used to decrease the shedding of fungal spores from the scalp.  Steroid medicines are used for severe cases that are very inflamed in conjunction with antifungal medication.  It is important that any family members or pets that have the fungus be treated. HOME CARE INSTRUCTIONS   Be sure to treat the rash completely  follow your caregiver's instructions. It can take a month or more to treat. If you do not treat it long enough, the rash can come back.  Watch for other cases in your family or pets.  Do not share brushes, combs, barrettes, or hats. Do not share towels.  Combs, brushes, and hats should be cleaned carefully and natural bristle brushes must be thrown away.  It is not necessary to shave the scalp or wear a hat during treatment.  Children may attend school once they start treatment with the oral medicine.  Be sure to follow up with your caregiver as directed to be sure the infection  is gone. SEEK MEDICAL CARE IF:   Rash is worse.  Rash is spreading.  Rash returns after treatment is completed.  The rash is not better in 2 weeks with treatment. Fungal infections are slow to respond to treatment. Some redness may remain for several weeks after the fungus is gone. SEEK IMMEDIATE MEDICAL CARE IF:  The area becomes red, warm, tender, and swollen.  You or your child has an oral temperature above 102 F (38.9 C), not controlled by medicine. Document Released: 03/29/2000 Document Revised: 06/24/2011 Document Reviewed: 05/11/2008 [facility] Patient Information 2014 [employer].

## 2013-07-12 NOTE — ED Provider Notes (Signed)
TIME SEEN: 10:30 PM  CHIEF COMPLAINT: Right scalp lesion  HPI: Patient is a 28 year old male with no significant past medical history who presents emergency department with a large fluctuant area to his right posterior scalp that has been draining this been present for the past 2 weeks. He was seen in the emergency department on 3/19 and started on Keflex and Bactrim for possible cellulitis. He states he's had no improvement of his symptoms with these medications and has had increased swelling of this area and now puslike drainage. Denies any fevers, chills, vomiting or diarrhea. He is not a diabetic and denies being in compromise. No history of IV drug use. He has had hair loss in this area.  ROS: See HPI Constitutional: no fever  Eyes: no drainage  ENT: no runny nose   Cardiovascular:  no chest pain  Resp: no SOB  GI: no vomiting GU: no dysuria Integumentary: no rash  Allergy: no hives  Musculoskeletal: no leg swelling  Neurological: no slurred speech ROS otherwise negative  PAST MEDICAL HISTORY/PAST SURGICAL HISTORY:  Past Medical History  Diagnosis Date  . No significant past medical history     MEDICATIONS:  Prior to Admission medications   Medication Sig Start Date End Date Taking? Authorizing Provider  cephALEXin (KEFLEX) 500 MG capsule 2 caps po bid x 7 days 07/01/13   Mora Bellman, PA-C  pseudoephedrine (SINUS & ALLERGY 12 HOUR) 120 MG 12 hr tablet Take 120 mg by mouth every 12 (twelve) hours as needed for congestion.    Historical Provider, MD  sulfamethoxazole-trimethoprim (SEPTRA DS) 800-160 MG per tablet Take 1 tablet by mouth every 12 (twelve) hours. 07/01/13   Mora Bellman, PA-C    ALLERGIES:  No Known Allergies  SOCIAL HISTORY:  History  Substance Use Topics  . Smoking status: Current Every Day Smoker -- 0.50 packs/day    Types: Cigarettes  . Smokeless tobacco: Not on file  . Alcohol Use: 4.2 oz/week    7 Cans of beer per week     Comment: often     FAMILY HISTORY: Family History  Problem Relation Age of Onset  . Hypertension Mother   . Diabetes Father     EXAM: BP 133/78  Pulse 85  Temp(Src) 98.2 F (36.8 C) (Oral)  Resp 16  SpO2 97% CONSTITUTIONAL: Alert and oriented and responds appropriately to questions. Well-appearing; well-nourished HEAD: Normocephalic EYES: Conjunctivae clear, PERRL ENT: normal nose; no rhinorrhea; moist mucous membranes; pharynx without lesions noted NECK: Supple, no meningismus, no LAD  CARD: RRR; S1 and S2 appreciated; no murmurs, no clicks, no rubs, no gallops RESP: Normal chest excursion without splinting or tachypnea; breath sounds clear and equal bilaterally; no wheezes, no rhonchi, no rales,  ABD/GI: Normal bowel sounds; non-distended; soft, non-tender, no rebound, no guarding BACK:  The back appears normal and is non-tender to palpation, there is no CVA tenderness EXT: Normal ROM in all joints; non-tender to palpation; no edema; normal capillary refill; no cyanosis    SKIN: Patient has a 6 x 7 cm fluctuant area to his right posterior scalp with multiple small punctate areas with purulent appearing drainage, there is associated alopecia, no induration or erythema or warmth, multiple broken hair follicles in this area NEURO: Moves all extremities equally PSYCH: The patient's mood and manner are appropriate. Grooming and personal hygiene are appropriate.  MEDICAL DECISION MAKING: Patient here with what appears to be a kerion. I do not feel he is associated infection. He has been  on antibiotics without any relief. Discussed with patient that incision and drainage would not help this wound heal. We'll discharge him on selenium sulfide shampoo and terbinafine. Will give dermatology outpatient followup information. Have given strict return precautions. Patient verbalizes understanding and is comfortable with plan.       Layla MawKristen N Skylah Delauter, DO 07/12/13 2327

## 2014-04-10 ENCOUNTER — Emergency Department (HOSPITAL_COMMUNITY)
Admission: EM | Admit: 2014-04-10 | Discharge: 2014-04-11 | Disposition: A | Discharge status: Home / Self Care | Payer: BC Managed Care – PPO | Attending: Emergency Medicine | Admitting: Emergency Medicine

## 2014-04-10 ENCOUNTER — Emergency Department (HOSPITAL_COMMUNITY): Payer: BC Managed Care – PPO

## 2014-04-10 ENCOUNTER — Encounter (HOSPITAL_COMMUNITY): Payer: Self-pay | Admitting: *Deleted

## 2014-04-10 DIAGNOSIS — Y9389 Activity, other specified: Secondary | ICD-10-CM | POA: Diagnosis not present

## 2014-04-10 DIAGNOSIS — Y9289 Other specified places as the place of occurrence of the external cause: Secondary | ICD-10-CM | POA: Diagnosis not present

## 2014-04-10 DIAGNOSIS — S29091A Other injury of muscle and tendon of front wall of thorax, initial encounter: Secondary | ICD-10-CM | POA: Diagnosis not present

## 2014-04-10 DIAGNOSIS — S4992XA Unspecified injury of left shoulder and upper arm, initial encounter: Secondary | ICD-10-CM | POA: Diagnosis present

## 2014-04-10 DIAGNOSIS — Z720 Tobacco use: Secondary | ICD-10-CM | POA: Insufficient documentation

## 2014-04-10 DIAGNOSIS — S46912A Strain of unspecified muscle, fascia and tendon at shoulder and upper arm level, left arm, initial encounter: Secondary | ICD-10-CM

## 2014-04-10 DIAGNOSIS — Y998 Other external cause status: Secondary | ICD-10-CM | POA: Diagnosis not present

## 2014-04-10 DIAGNOSIS — Z79899 Other long term (current) drug therapy: Secondary | ICD-10-CM | POA: Diagnosis not present

## 2014-04-10 DIAGNOSIS — R0789 Other chest pain: Secondary | ICD-10-CM

## 2014-04-10 MED ORDER — OXYCODONE-ACETAMINOPHEN 5-325 MG PO TABS
2.0000 | ORAL_TABLET | Freq: Once | ORAL | Status: AC
  Administered 2014-04-10: 2 via ORAL
  Filled 2014-04-10: qty 2
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
## 2014-04-11 MED ORDER — OXYCODONE-ACETAMINOPHEN 5-325 MG PO TABS: 1.0000 | ORAL_TABLET | ORAL | Status: DC | PRN

## 2014-04-11 MED ORDER — CYCLOBENZAPRINE HCL 10 MG PO TABS: 10.0000 mg | ORAL_TABLET | Freq: Two times a day (BID) | ORAL | Status: DC | PRN

## 2014-04-11 MED ORDER — IBUPROFEN 800 MG PO TABS: 800.0000 mg | ORAL_TABLET | Freq: Three times a day (TID) | ORAL | Status: DC

## 2014-04-11 NOTE — Discharge Instructions (Signed)
Muscle Strain °A muscle strain is an injury that occurs when a muscle is stretched beyond its normal length. Usually a small number of muscle fibers are torn when this happens. Muscle strain is rated in degrees. First-degree strains have the least amount of muscle fiber tearing and pain. Second-degree and third-degree strains have increasingly more tearing and pain.  °Usually, recovery from muscle strain takes 1-2 weeks. Complete healing takes 5-6 weeks.  °CAUSES  °Muscle strain happens when a sudden, violent force placed on a muscle stretches it too far. This may occur with lifting, sports, or a fall.  °RISK FACTORS °Muscle strain is especially common in athletes.  °SIGNS AND SYMPTOMS °At the site of the muscle strain, there may be: °· Pain. °· Bruising. °· Swelling. °· Difficulty using the muscle due to pain or lack of normal function. °DIAGNOSIS  °Your health care provider will perform a physical exam and ask about your medical history. °TREATMENT  °Often, the best treatment for a muscle strain is resting, icing, and applying cold compresses to the injured area.   °HOME CARE INSTRUCTIONS  °· Use the PRICE method of treatment to promote muscle healing during the first 2-3 days after your injury. The PRICE method involves: °¨ Protecting the muscle from being injured again. °¨ Restricting your activity and resting the injured body part. °¨ Icing your injury. To do this, put ice in a plastic bag. Place a towel between your skin and the bag. Then, apply the ice and leave it on from 15-20 minutes each hour. After the third day, switch to moist heat packs. °¨ Apply compression to the injured area with a splint or elastic bandage. Be careful not to wrap it too tightly. This may interfere with blood circulation or increase swelling. °¨ Elevate the injured body part above the level of your heart as often as you can. °· Only take over-the-counter or prescription medicines for pain, discomfort, or fever as directed by your  health care provider. °· Warming up prior to exercise helps to prevent future muscle strains. °SEEK MEDICAL CARE IF:  °· You have increasing pain or swelling in the injured area. °· You have numbness, tingling, or a significant loss of strength in the injured area. °MAKE SURE YOU:  °· Understand these instructions. °· Will watch your condition. °· Will get help right away if you are not doing well or get worse. °Document Released: 04/01/2005 Document Revised: 01/20/2013 Document Reviewed: 10/29/2012 °[employer]® Patient Information ©2015 [employer]. This information is not intended to replace advice given to you by your health care provider. Make sure you discuss any questions you have with your health care provider. °Cryotherapy °Cryotherapy means treatment with cold. Ice or gel packs can be used to reduce both pain and swelling. Ice is the most helpful within the first 24 to 48 hours after an injury or flare-up from overusing a muscle or joint. Sprains, strains, spasms, burning pain, shooting pain, and aches can all be eased with ice. Ice can also be used when recovering from surgery. Ice is effective, has very few side effects, and is safe for most people to use. °PRECAUTIONS  °Ice is not a safe treatment option for people with: °· Raynaud phenomenon. This is a condition affecting small blood vessels in the extremities. Exposure to cold may cause your problems to return. °· Cold hypersensitivity. There are many forms of cold hypersensitivity, including: °· Cold urticaria. Red, itchy hives appear on the skin when the tissues begin to warm after being   iced. °· Cold erythema. This is a red, itchy rash caused by exposure to cold. °· Cold hemoglobinuria. Red blood cells break down when the tissues begin to warm after being iced. The hemoglobin that carry oxygen are passed into the urine because they cannot combine with blood proteins fast enough. °· Numbness or altered sensitivity in the area being iced. °If you have  any of the following conditions, do not use ice until you have discussed cryotherapy with your caregiver: °· Heart conditions, such as arrhythmia, angina, or chronic heart disease. °· High blood pressure. °· Healing wounds or open skin in the area being iced. °· Current infections. °· Rheumatoid arthritis. °· Poor circulation. °· Diabetes. °Ice slows the blood flow in the region it is applied. This is beneficial when trying to stop inflamed tissues from spreading irritating chemicals to surrounding tissues. However, if you expose your skin to cold temperatures for too long or without the proper protection, you can damage your skin or nerves. Watch for signs of skin damage due to cold. °HOME CARE INSTRUCTIONS °Follow these tips to use ice and cold packs safely. °· Place a dry or damp towel between the ice and skin. A damp towel will cool the skin more quickly, so you may need to shorten the time that the ice is used. °· For a more rapid response, add gentle compression to the ice. °· Ice for no more than 10 to 20 minutes at a time. The bonier the area you are icing, the less time it will take to get the benefits of ice. °· Check your skin after 5 minutes to make sure there are no signs of a poor response to cold or skin damage. °· Rest 20 minutes or more between uses. °· Once your skin is numb, you can end your treatment. You can test numbness by very lightly touching your skin. The touch should be so light that you do not see the skin dimple from the pressure of your fingertip. When using ice, most people will feel these normal sensations in this order: cold, burning, aching, and numbness. °· Do not use ice on someone who cannot communicate their responses to pain, such as small children or people with dementia. °HOW TO MAKE AN ICE PACK °Ice packs are the most common way to use ice therapy. Other methods include ice massage, ice baths, and cryosprays. Muscle creams that cause a cold, tingly feeling do not offer the  same benefits that ice offers and should not be used as a substitute unless recommended by your caregiver. °To make an ice pack, do one of the following: °· Place crushed ice or a bag of frozen vegetables in a sealable plastic bag. Squeeze out the excess air. Place this bag inside another plastic bag. Slide the bag into a pillowcase or place a damp towel between your skin and the bag. °· Mix 3 parts water with 1 part rubbing alcohol. Freeze the mixture in a sealable plastic bag. When you remove the mixture from the freezer, it will be slushy. Squeeze out the excess air. Place this bag inside another plastic bag. Slide the bag into a pillowcase or place a damp towel between your skin and the bag. °SEEK MEDICAL CARE IF: °· You develop white spots on your skin. This may give the skin a blotchy (mottled) appearance. °· Your skin turns blue or pale. °· Your skin becomes waxy or hard. °· Your swelling gets worse. °MAKE SURE YOU:  °· Understand these instructions. °·   Will watch your condition. °· Will get help right away if you are not doing well or get worse. °Document Released: 11/26/2010 Document Revised: 08/16/2013 Document Reviewed: 11/26/2010 °[employer]® Patient Information ©2015 [employer]. This information is not intended to replace advice given to you by your health care provider. Make sure you discuss any questions you have with your health care provider. °Motor Vehicle Collision °It is common to have multiple bruises and sore muscles after a motor vehicle collision (MVC). These tend to feel worse for the first 24 hours. You may have the most stiffness and soreness over the first several hours. You may also feel worse when you wake up the first morning after your collision. After this point, you will usually begin to improve with each day. The speed of improvement often depends on the severity of the collision, the number of injuries, and the location and nature of these injuries. °HOME CARE INSTRUCTIONS °· Put  ice on the injured area. °· Put ice in a plastic bag. °· Place a towel between your skin and the bag. °· Leave the ice on for 15-20 minutes, 3-4 times a day, or as directed by your health care provider. °· Drink enough fluids to keep your urine clear or pale yellow. Do not drink alcohol. °· Take a warm shower or bath once or twice a day. This will increase blood flow to sore muscles. °· You may return to activities as directed by your caregiver. Be careful when lifting, as this may aggravate neck or back pain. °· Only take over-the-counter or prescription medicines for pain, discomfort, or fever as directed by your caregiver. Do not use aspirin. This may increase bruising and bleeding. °SEEK IMMEDIATE MEDICAL CARE IF: °· You have numbness, tingling, or weakness in the arms or legs. °· You develop severe headaches not relieved with medicine. °· You have severe neck pain, especially tenderness in the middle of the back of your neck. °· You have changes in bowel or bladder control. °· There is increasing pain in any area of the body. °· You have shortness of breath, light-headedness, dizziness, or fainting. °· You have chest pain. °· You feel sick to your stomach (nauseous), throw up (vomit), or sweat. °· You have increasing abdominal discomfort. °· There is blood in your urine, stool, or vomit. °· You have pain in your shoulder (shoulder strap areas). °· You feel your symptoms are getting worse. °MAKE SURE YOU: °· Understand these instructions. °· Will watch your condition. °· Will get help right away if you are not doing well or get worse. °Document Released: 04/01/2005 Document Revised: 08/16/2013 Document Reviewed: 08/29/2010 °[employer]® Patient Information ©2015 [employer]. This information is not intended to replace advice given to you by your health care provider. Make sure you discuss any questions you have with your health care provider. ° °

## 2014-04-14 ENCOUNTER — Encounter (HOSPITAL_COMMUNITY): Payer: Self-pay | Admitting: Emergency Medicine

## 2014-04-14 ENCOUNTER — Emergency Department (HOSPITAL_COMMUNITY): Payer: BC Managed Care – PPO

## 2014-04-14 ENCOUNTER — Emergency Department (HOSPITAL_COMMUNITY)
Admission: EM | Admit: 2014-04-14 | Discharge: 2014-04-14 | Disposition: A | Discharge status: Home / Self Care | Payer: BC Managed Care – PPO | Attending: Emergency Medicine | Admitting: Emergency Medicine

## 2014-04-14 DIAGNOSIS — T07XXXA Unspecified multiple injuries, initial encounter: Secondary | ICD-10-CM

## 2014-04-14 DIAGNOSIS — Y92410 Unspecified street and highway as the place of occurrence of the external cause: Secondary | ICD-10-CM | POA: Diagnosis not present

## 2014-04-14 DIAGNOSIS — S4992XA Unspecified injury of left shoulder and upper arm, initial encounter: Secondary | ICD-10-CM | POA: Diagnosis not present

## 2014-04-14 DIAGNOSIS — R0789 Other chest pain: Secondary | ICD-10-CM

## 2014-04-14 DIAGNOSIS — Y998 Other external cause status: Secondary | ICD-10-CM | POA: Insufficient documentation

## 2014-04-14 DIAGNOSIS — S29091A Other injury of muscle and tendon of front wall of thorax, initial encounter: Secondary | ICD-10-CM | POA: Insufficient documentation

## 2014-04-14 DIAGNOSIS — Z720 Tobacco use: Secondary | ICD-10-CM | POA: Diagnosis not present

## 2014-04-14 DIAGNOSIS — M25512 Pain in left shoulder: Secondary | ICD-10-CM

## 2014-04-14 DIAGNOSIS — Y9389 Activity, other specified: Secondary | ICD-10-CM | POA: Insufficient documentation
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
## 2014-04-14 NOTE — ED Notes (Signed)
MD at bedside. EDP WARD PRESENT 

## 2015-08-27 ENCOUNTER — Emergency Department (HOSPITAL_COMMUNITY)
Admission: EM | Admit: 2015-08-27 | Discharge: 2015-08-27 | Disposition: A | Discharge status: Home / Self Care | Payer: BLUE CROSS/BLUE SHIELD | Attending: Emergency Medicine | Admitting: Emergency Medicine

## 2015-08-27 ENCOUNTER — Emergency Department (HOSPITAL_COMMUNITY): Payer: BLUE CROSS/BLUE SHIELD

## 2015-08-27 ENCOUNTER — Encounter (HOSPITAL_COMMUNITY): Payer: Self-pay | Admitting: Emergency Medicine

## 2015-08-27 DIAGNOSIS — R0789 Other chest pain: Secondary | ICD-10-CM | POA: Diagnosis present

## 2015-08-27 DIAGNOSIS — F17210 Nicotine dependence, cigarettes, uncomplicated: Secondary | ICD-10-CM | POA: Diagnosis not present

## 2015-08-27 DIAGNOSIS — Y9389 Activity, other specified: Secondary | ICD-10-CM | POA: Diagnosis not present

## 2015-08-27 DIAGNOSIS — Y998 Other external cause status: Secondary | ICD-10-CM | POA: Insufficient documentation

## 2015-08-27 DIAGNOSIS — X58XXXA Exposure to other specified factors, initial encounter: Secondary | ICD-10-CM | POA: Diagnosis not present

## 2015-08-27 DIAGNOSIS — Y9289 Other specified places as the place of occurrence of the external cause: Secondary | ICD-10-CM | POA: Insufficient documentation

## 2015-08-27 DIAGNOSIS — S2341XA Sprain of ribs, initial encounter: Secondary | ICD-10-CM | POA: Insufficient documentation

## 2015-08-27 DIAGNOSIS — S29011A Strain of muscle and tendon of front wall of thorax, initial encounter: Secondary | ICD-10-CM | POA: Insufficient documentation

## 2015-08-27 DIAGNOSIS — S29019A Strain of muscle and tendon of unspecified wall of thorax, initial encounter: Secondary | ICD-10-CM

## 2015-08-27 MED ORDER — CYCLOBENZAPRINE HCL 10 MG PO TABS: 10.0000 mg | ORAL_TABLET | Freq: Two times a day (BID) | ORAL | Status: DC | PRN

## 2015-08-27 MED ORDER — NAPROXEN 500 MG PO TABS: 500.0000 mg | ORAL_TABLET | Freq: Two times a day (BID) | ORAL | Status: DC

## 2015-08-27 NOTE — ED Notes (Signed)
Pt states that for about 4 days he has had right side rib pain that he describes as sharp. Pt states his pain feels almost like he pulled a muscle, but pt denies any heavy lifting. Pt states last week he also had abdominal pain, but he denies it at this time. Pt states pain is worse when he takes a deep breath or coughs. Pt denies having a cough or cold at this time

## 2015-08-27 NOTE — ED Provider Notes (Signed)
CSN: [id number]     Arrival date & time 08/27/15  1841 History   First MD Initiated Contact with Patient 08/27/15 1857     Chief Complaint  Patient presents with  . Rib Pain     Rib right side     (Consider location/radiation/quality/duration/timing/severity/associated sxs/prior Treatment) HPI   30 year old male with no severe medical history presents to concern of right-sided rib pain. Pain is worse with cough, sneeze, movements and palpation is most consistent with musculoskeletal pain. Patient works doing Youth worker.   Right rib pain present for 5 days, severe pain.  Pain severe with movement, palpation, sneezing, coughing.  Has not tried anything for it. No fever. No n/v/diaphoresis.   Past Medical History  Diagnosis Date  . No significant past medical history    Past Surgical History  Procedure Laterality Date  . Abdominal surgery     Family History  Problem Relation Age of Onset  . Hypertension Mother   . Diabetes Father    Social History  Substance Use Topics  . Smoking status: Current Every Day Smoker -- 0.50 packs/day    Types: Cigarettes  . Smokeless tobacco: None  . Alcohol Use: 4.2 oz/week    7 Cans of beer per week     Comment: often    Review of Systems  Constitutional: Negative for fever.  HENT: Negative for sore throat.   Eyes: Negative for visual disturbance.  Respiratory: Positive for shortness of breath.   Cardiovascular: Positive for chest pain.  Gastrointestinal: Negative for nausea, vomiting and abdominal pain.  Genitourinary: Negative for difficulty urinating.  Musculoskeletal: Negative for back pain and neck stiffness.  Skin: Negative for rash.  Neurological: Negative for syncope and headaches.      Allergies  Review of patient's allergies indicates no known allergies.  Home Medications   Prior to Admission medications   Medication Sig Start Date End Date Taking? Authorizing Provider  cetirizine (ZYRTEC) 10 MG tablet Take 10 mg  by mouth daily as needed for allergies.   Yes Historical Provider, MD  loratadine (CLARITIN) 10 MG tablet Take 10 mg by mouth daily as needed for allergies.   Yes Historical Provider, MD  cyclobenzaprine (FLEXERIL) 10 MG tablet Take 1 tablet (10 mg total) by mouth 2 (two) times daily as needed for muscle spasms. 08/27/15   Alvira Monday, MD  naproxen (NAPROSYN) 500 MG tablet Take 1 tablet (500 mg total) by mouth 2 (two) times daily with a meal. 08/27/15   Alvira Monday, MD   BP 135/99 mmHg  Pulse 72  Temp(Src) 98.9 F (37.2 C) (Oral)  Resp 13  SpO2 97% Physical Exam  Constitutional: He is oriented to person, place, and time. He appears well-developed and well-nourished. No distress.  HENT:  Head: Normocephalic and atraumatic.  Eyes: Conjunctivae and EOM are normal.  Neck: Normal range of motion.  Cardiovascular: Normal rate, regular rhythm, normal heart sounds and intact distal pulses.  Exam reveals no gallop and no friction rub.   No murmur heard. Pulmonary/Chest: Effort normal and breath sounds normal. No respiratory distress. He has no wheezes. He has no rales. He exhibits tenderness.  Abdominal: Soft. He exhibits no distension. There is no tenderness. There is no guarding.  Musculoskeletal: He exhibits no edema.  Neurological: He is alert and oriented to person, place, and time.  Skin: Skin is warm and dry. He is not diaphoretic.  Nursing note and vitals reviewed.   ED Course  Procedures (including critical care time) Labs  Review Labs Reviewed - No data to display  Imaging Review Dg Chest 2 View  08/27/2015  CLINICAL DATA:  Initial evaluation for acute right anterior chest pain for 1 week. EXAM: CHEST  2 VIEW COMPARISON:  Prior study from 04/14/2014. FINDINGS: The cardiac and mediastinal silhouettes are stable in size and contour, and remain within normal limits. The lungs are normally inflated. Mild diffuse bronchitic changes, similar to prior, and likely chronic. No airspace  consolidation, pleural effusion, or pulmonary edema is identified. There is no pneumothorax. No acute osseous abnormality identified. IMPRESSION: 1. No active cardiopulmonary disease. 2. Mild diffuse bronchitic changes, similar to prior. Electronically Signed   By: Rise MuBenjamin  McClintock M.D.   On: 08/27/2015 21:22   I have personally reviewed and evaluated these images and lab results as part of my medical decision-making.   EKG Interpretation   Date/Time:  Sunday Aug 27 2015 20:00:54 EDT Ventricular Rate:  75 PR Interval:  208 QRS Duration: 91 QT Interval:  383 QTC Calculation: 428 R Axis:   39 Text Interpretation:  Sinus rhythm Borderline prolonged PR interval ST  elev, probable normal early repol pattern No significant change since last  tracing Confirmed by [facility] CenterCHLOSSMAN MD, Miloh Alcocer ([id number]) on 08/27/2015 8:22:27 PM      MDM   Final diagnoses:  Sprain and strain of ribs, initial encounter  Right-sided chest wall pain   30 year old male with no severe medical history presents to concern of right-sided rib pain. Pain is worse with cough, sneeze, movements and palpation is most consistent with musculoskeletal pain. Patient works doing Youth workermanual labor. His chest x-ray shows no sign of pneumonia, no pneumothorax, no other abnormalities.  EKG shows benign early repolarization without any other acute changes, similar to prior. Abdominal exam is benign and have low suspicion for intra-abdominal cause of symptoms. Have low suspicion for pulmonary embolus, patient is perc negative.  Given prescription for naproxen, and Flexeril. Patient discharged in stable condition with understanding of reasons to return.   Alvira MondayErin Siena Poehler, MD 08/28/15 [phone]

## 2015-10-27 ENCOUNTER — Ambulatory Visit (INDEPENDENT_AMBULATORY_CARE_PROVIDER_SITE_OTHER): Payer: BLUE CROSS/BLUE SHIELD | Admitting: Emergency Medicine

## 2015-10-27 ENCOUNTER — Ambulatory Visit: Payer: Self-pay

## 2015-10-27 VITALS — BP 122/84 | HR 79 | Temp 97.9°F | Resp 18 | Ht 74.0 in | Wt 262.0 lb

## 2015-10-27 DIAGNOSIS — R635 Abnormal weight gain: Secondary | ICD-10-CM

## 2015-10-27 DIAGNOSIS — Z113 Encounter for screening for infections with a predominantly sexual mode of transmission: Secondary | ICD-10-CM | POA: Diagnosis not present

## 2015-10-27 DIAGNOSIS — D649 Anemia, unspecified: Secondary | ICD-10-CM

## 2015-10-27 DIAGNOSIS — E669 Obesity, unspecified: Secondary | ICD-10-CM

## 2015-10-27 DIAGNOSIS — N62 Hypertrophy of breast: Secondary | ICD-10-CM | POA: Diagnosis not present

## 2015-10-27 DIAGNOSIS — Z0000 Encounter for general adult medical examination without abnormal findings: Secondary | ICD-10-CM | POA: Diagnosis not present

## 2015-10-27 DIAGNOSIS — Z13220 Encounter for screening for lipoid disorders: Secondary | ICD-10-CM

## 2015-10-27 LAB — TSH: TSH: 0.55 mIU/L (ref 0.40–4.50)

## 2015-10-27 LAB — LIPID PANEL
Cholesterol: 129 mg/dL (ref 125–200)
HDL: 37 mg/dL — ABNORMAL LOW (ref >=40)
LDL CALC: 83 mg/dL (ref <130)
TRIGLYCERIDES: 45 mg/dL (ref <150)
Total CHOL/HDL Ratio: 3.5 Ratio (ref <=5.0)
VLDL: 9 mg/dL (ref <30)

## 2015-10-27 LAB — POCT CBC
Granulocyte percent: 58.2 %G (ref 37–80)
HCT, POC: 38.3 % — AB (ref 43.5–53.7)
Hemoglobin: 13.2 g/dL — AB (ref 14.1–18.1)
LYMPH, POC: 3.7 — AB (ref 0.6–3.4)
MCH, POC: 28.5 pg (ref 27–31.2)
MCHC: 34.6 g/dL (ref 31.8–35.4)
MCV: 82.6 fL (ref 80–97)
MID (cbc): 1.1 — AB (ref 0–0.9)
MPV: 8.6 fL (ref 0–99.8)
PLATELET COUNT, POC: 215 10*3/uL (ref 142–424)
POC Granulocyte: 6.7 (ref 2–6.9)
POC LYMPH PERCENT: 31.8 %L (ref 10–50)
POC MID %: 10 %M (ref 0–12)
RBC: 4.64 M/uL — AB (ref 4.69–6.13)
RDW, POC: 12.9 %
WBC: 11.5 10*3/uL — AB (ref 4.6–10.2)

## 2015-10-27 LAB — POCT GLYCOSYLATED HEMOGLOBIN (HGB A1C): Hemoglobin A1C: 4.8

## 2015-10-27 LAB — POCT URINALYSIS DIP (MANUAL ENTRY)
BILIRUBIN UA: NEGATIVE
BILIRUBIN UA: NEGATIVE
Glucose, UA: NEGATIVE
Leukocytes, UA: NEGATIVE
Nitrite, UA: NEGATIVE
PH UA: 6
Protein Ur, POC: NEGATIVE
Spec Grav, UA: >=1.03
Urobilinogen, UA: 1

## 2015-10-27 LAB — HIV ANTIBODY (ROUTINE TESTING W REFLEX): HIV 1&2 Ab, 4th Generation: NONREACTIVE

## 2015-10-27 LAB — HEPATITIS B SURFACE ANTIGEN: HEP B S AG: NEGATIVE

## 2015-10-27 LAB — POC MICROSCOPIC URINALYSIS (UMFC)

## 2015-10-27 LAB — HEPATITIS C ANTIBODY: HCV Ab: NEGATIVE

## 2015-10-27 NOTE — Patient Instructions (Addendum)
   IF you received an x-ray today, you will receive an invoice from [facility]. Please contact [facility] at [phone] with questions or concerns regarding your invoice.   IF you received labwork today, you will receive an invoice from [facility]. Please contact [facility] at [phone] with questions or concerns regarding your invoice.   Our billing staff will not be able to assist you with questions regarding bills from these companies.  You will be contacted with the lab results as soon as they are available. The fastest way to get your results is to activate your My Chart account. Instructions are located on the last page of this paperwork. If you have not heard from us regarding the results in 2 weeks, please contact this office.    Health Maintenance, Male A healthy lifestyle and preventative care can promote health and wellness.  Maintain regular health, dental, and eye exams.  Eat a healthy diet. Foods like vegetables, fruits, whole grains, low-fat dairy products, and lean protein foods contain the nutrients you need and are low in calories. Decrease your intake of foods high in solid fats, added sugars, and salt. Get information about a proper diet from your health care provider, if necessary.  Regular physical exercise is one of the most important things you can do for your health. Most adults should get at least 150 minutes of moderate-intensity exercise (any activity that increases your heart rate and causes you to sweat) each week. In addition, most adults need muscle-strengthening exercises on 2 or more days a week.   Maintain a healthy weight. The body mass index (BMI) is a screening tool to identify possible weight problems. It provides an estimate of body fat based on height and weight. Your health care provider can find your BMI and can help you achieve or maintain a healthy weight. For males 20 years and older:  A BMI  below 18.5 is considered underweight.  A BMI of 18.5 to 24.9 is normal.  A BMI of 25 to 29.9 is considered overweight.  A BMI of 30 and above is considered obese.  Maintain normal blood lipids and cholesterol by exercising and minimizing your intake of saturated fat. Eat a balanced diet with plenty of fruits and vegetables. Blood tests for lipids and cholesterol should begin at age 20 and be repeated every 5 years. If your lipid or cholesterol levels are high, you are over age 50, or you are at high risk for heart disease, you may need your cholesterol levels checked more frequently.Ongoing high lipid and cholesterol levels should be treated with medicines if diet and exercise are not working.  If you smoke, find out from your health care provider how to quit. If you do not use tobacco, do not start.  Lung cancer screening is recommended for adults aged 55-80 years who are at high risk for developing lung cancer because of a history of smoking. A yearly low-dose CT scan of the lungs is recommended for people who have at least a 30-pack-year history of smoking and are current smokers or have quit within the past 15 years. A pack year of smoking is smoking an average of 1 pack of cigarettes a day for 1 year (for example, a 30-pack-year history of smoking could mean smoking 1 pack a day for 30 years or 2 packs a day for 15 years). Yearly screening should continue until the smoker has stopped smoking for at least 15 years. Yearly screening should be stopped   for people who develop a health problem that would prevent them from having lung cancer treatment.  If you choose to drink alcohol, do not have more than 2 drinks per day. One drink is considered to be 12 oz (360 mL) of beer, 5 oz (150 mL) of wine, or 1.5 oz (45 mL) of liquor.  Avoid the use of street drugs. Do not share needles with anyone. Ask for help if you need support or instructions about stopping the use of drugs.  High blood pressure  causes heart disease and increases the risk of stroke. High blood pressure is more likely to develop in:  People who have blood pressure in the end of the normal range (100-139/85-89 mm Hg).  People who are overweight or obese.  People who are African American.  If you are 18-39 years of age, have your blood pressure checked every 3-5 years. If you are 40 years of age or older, have your blood pressure checked every year. You should have your blood pressure measured twice--once when you are at a hospital or clinic, and once when you are not at a hospital or clinic. Record the average of the two measurements. To check your blood pressure when you are not at a hospital or clinic, you can use:  An automated blood pressure machine at a pharmacy.  A home blood pressure monitor.  If you are 45-79 years old, ask your health care provider if you should take aspirin to prevent heart disease.  Diabetes screening involves taking a blood sample to check your fasting blood sugar level. This should be done once every 3 years after age 45 if you are at a normal weight and without risk factors for diabetes. Testing should be considered at a younger age or be carried out more frequently if you are overweight and have at least 1 risk factor for diabetes.  Colorectal cancer can be detected and often prevented. Most routine colorectal cancer screening begins at the age of 50 and continues through age 75. However, your health care provider may recommend screening at an earlier age if you have risk factors for colon cancer. On a yearly basis, your health care provider may provide home test kits to check for hidden blood in the stool. A small camera at the end of a tube may be used to directly examine the colon (sigmoidoscopy or colonoscopy) to detect the earliest forms of colorectal cancer. Talk to your health care provider about this at age 50 when routine screening begins. A direct exam of the colon should be repeated  every 5-10 years through age 75, unless early forms of precancerous polyps or small growths are found.  People who are at an increased risk for hepatitis B should be screened for this virus. You are considered at high risk for hepatitis B if:  You were born in a country where hepatitis B occurs often. Talk with your health care provider about which countries are considered high risk.  Your parents were born in a high-risk country and you have not received a shot to protect against hepatitis B (hepatitis B vaccine).  You have HIV or AIDS.  You use needles to inject street drugs.  You live with, or have sex with, someone who has hepatitis B.  You are a man who has sex with other men (MSM).  You get hemodialysis treatment.  You take certain medicines for conditions like cancer, organ transplantation, and autoimmune conditions.  Hepatitis C blood testing is recommended for   all people born from 1945 through 1965 and any individual with known risk factors for hepatitis C.  Healthy men should no longer receive prostate-specific antigen (PSA) blood tests as part of routine cancer screening. Talk to your health care provider about prostate cancer screening.  Testicular cancer screening is not recommended for adolescents or adult males who have no symptoms. Screening includes self-exam, a health care provider exam, and other screening tests. Consult with your health care provider about any symptoms you have or any concerns you have about testicular cancer.  Practice safe sex. Use condoms and avoid high-risk sexual practices to reduce the spread of sexually transmitted infections (STIs).  You should be screened for STIs, including gonorrhea and chlamydia if:  You are sexually active and are younger than 24 years.  You are older than 24 years, and your health care provider tells you that you are at risk for this type of infection.  Your sexual activity has changed since you were last screened,  and you are at an increased risk for chlamydia or gonorrhea. Ask your health care provider if you are at risk.  If you are at risk of being infected with HIV, it is recommended that you take a prescription medicine daily to prevent HIV infection. This is called pre-exposure prophylaxis (PrEP). You are considered at risk if:  You are a man who has sex with other men (MSM).  You are a heterosexual man who is sexually active with multiple partners.  You take drugs by injection.  You are sexually active with a partner who has HIV.  Talk with your health care provider about whether you are at high risk of being infected with HIV. If you choose to begin PrEP, you should first be tested for HIV. You should then be tested every 3 months for as long as you are taking PrEP.  Use sunscreen. Apply sunscreen liberally and repeatedly throughout the day. You should seek shade when your shadow is shorter than you. Protect yourself by wearing long sleeves, pants, a wide-brimmed hat, and sunglasses year round whenever you are outdoors.  Tell your health care provider of new moles or changes in moles, especially if there is a change in shape or color. Also, tell your health care provider if a mole is larger than the size of a pencil eraser.  A one-time screening for abdominal aortic aneurysm (AAA) and surgical repair of large AAAs by ultrasound is recommended for men aged 65-75 years who are current or former smokers.  Stay current with your vaccines (immunizations).   This information is not intended to replace advice given to you by your health care provider. Make sure you discuss any questions you have with your health care provider.   Document Released: 09/28/2007 Document Revised: 04/22/2014 Document Reviewed: 08/27/2010 Elsevier Interactive Patient Education 2016 Elsevier Inc.  
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
## 2015-10-28 LAB — PROLACTIN: PROLACTIN: 5.5 ng/mL (ref 2.0–18.0)

## 2015-10-28 LAB — SICKLE CELL SCREEN: Sickle Cell Screen: NEGATIVE

## 2015-10-28 LAB — RPR

## 2015-10-30 LAB — GC/CHLAMYDIA PROBE AMP
CT Probe RNA: NOT DETECTED
GC Probe RNA: NOT DETECTED

## 2016-02-21 ENCOUNTER — Encounter (HOSPITAL_COMMUNITY): Payer: Self-pay | Admitting: Emergency Medicine

## 2016-02-21 ENCOUNTER — Emergency Department (HOSPITAL_COMMUNITY)
Admission: EM | Admit: 2016-02-21 | Discharge: 2016-02-22 | Disposition: A | Discharge status: Home / Self Care | Payer: BLUE CROSS/BLUE SHIELD | Attending: Emergency Medicine | Admitting: Emergency Medicine

## 2016-02-21 DIAGNOSIS — F17210 Nicotine dependence, cigarettes, uncomplicated: Secondary | ICD-10-CM | POA: Insufficient documentation

## 2016-02-21 DIAGNOSIS — L02416 Cutaneous abscess of left lower limb: Secondary | ICD-10-CM | POA: Diagnosis not present

## 2016-02-21 DIAGNOSIS — L03818 Cellulitis of other sites: Secondary | ICD-10-CM | POA: Insufficient documentation

## 2016-02-21 DIAGNOSIS — L0291 Cutaneous abscess, unspecified: Secondary | ICD-10-CM

## 2016-02-21 NOTE — ED Triage Notes (Signed)
Patient complaining of boil on left hip. Skin is intact. There has not been any drainage from boil. Patient states that it is painful.

## 2016-02-22 MED ORDER — DOXYCYCLINE HYCLATE 100 MG PO CAPS: 100.0000 mg | ORAL_CAPSULE | Freq: Two times a day (BID) | ORAL | 0 refills | Status: DC

## 2016-02-22 MED ORDER — IBUPROFEN 800 MG PO TABS: 800.0000 mg | ORAL_TABLET | Freq: Three times a day (TID) | ORAL | 0 refills | Status: DC

## 2016-02-22 MED ORDER — LIDOCAINE-EPINEPHRINE (PF) 2 %-1:200000 IJ SOLN
20.0000 mL | Freq: Once | INTRAMUSCULAR | Status: AC
  Administered 2016-02-22: 20 mL
  Filled 2016-02-22: qty 20

## 2016-02-22 NOTE — Discharge Instructions (Signed)
You had an abscess that needed to be incised and drained. You also have some mild surrounding cellulitis. Please complete the full course of doxycycline (antibiotics), and take ibuprofen every 6 hours as needed for pain. Shower when you get home, and keep the area clean, dry, and covered by a sterile dressing. If worsening fever, pain, swelling, redness, or warmth outside of the marked area on your hip, please come back to the ED.

## 2016-02-22 NOTE — ED Provider Notes (Signed)
[facility]-EMERGENCY DEPT Provider Note   CSN: [id number] Arrival date & time: 02/21/16  2319     History   Chief Complaint Chief Complaint  Patient presents with  . Abscess    HPI Hunter Koch is a 30 y.o. male.  Patient is 30 yo M with no significant PMH, presenting with painful abscess to his left hip. States he noticed a small pustule several weeks ago at same location, that would drain and get smaller in size, but then return. However, on Monday it became larger, swollen, and more painful. Denies any recent drainage, fevers, or chills. Pain is aggravated by clothing when he walks, but having no difficulty ambulating or swelling of joints. He tried hot compresses without relief. No other complaints.      Past Medical History:  Diagnosis Date  . No significant past medical history     Patient Active Problem List   Diagnosis Date Noted  . No significant past medical history     Past Surgical History:  Procedure Laterality Date  . ABDOMINAL SURGERY         Home Medications    Prior to Admission medications   Medication Sig Start Date End Date Taking? Authorizing Provider  cyclobenzaprine (FLEXERIL) 10 MG tablet Take 1 tablet (10 mg total) by mouth 2 (two) times daily as needed for muscle spasms. Patient not taking: Reported on 10/27/2015 08/27/15   Alvira MondayErin Schlossman, MD    Family History Family History  Problem Relation Age of Onset  . Hypertension Mother   . Diabetes Father     Social History Social History  Substance Use Topics  . Smoking status: Current Every Day Smoker    Packs/day: 0.50    Types: Cigarettes  . Smokeless tobacco: Never Used  . Alcohol use 4.2 oz/week    7 Cans of beer per week     Comment: often     Allergies   Patient has no known allergies.   Review of Systems Review of Systems  Constitutional: Negative for chills and fever.  Musculoskeletal: Negative for gait problem and joint swelling.  Skin: Positive for wound.    Hematological: Negative for adenopathy.     Physical Exam Updated Vital Signs BP 139/98 (BP Location: Left Arm)   Pulse 102   Temp 98.5 F (36.9 C) (Oral)   Resp 20   Ht 6\' 2"  (1.88 m)   Wt 122.5 kg   SpO2 99%   BMI 34.67 kg/m   Physical Exam  Constitutional:  Obese male, lying comfortably in bed, no acute distress and non-toxic appearance.  HENT:  Head: Normocephalic and atraumatic.  Mouth/Throat: Oropharynx is clear and moist.  Eyes: Conjunctivae are normal.  Neck: Normal range of motion.  Cardiovascular: Regular rhythm and intact distal pulses.  Tachycardia present.   Pulmonary/Chest: Effort normal. No respiratory distress.  Abdominal: Soft. He exhibits no distension. There is no tenderness. There is no guarding.  Musculoskeletal: Normal range of motion. He exhibits no edema or tenderness.  Left hip with FROM, with no bony or joint line TTP.  Neurological: He is alert.  Skin: Skin is warm and dry.  2 cm raised pustule with surrounding area of erythematous, swollen, and indurated tissue noted.  Psychiatric: He has a normal mood and affect.  Nursing note and vitals reviewed.    ED Treatments / Results  Labs (all labs ordered are listed, but only abnormal results are displayed) Labs Reviewed - No data to display  EKG  EKG Interpretation  None       Radiology No results found.  Procedures .Marland Kitchen.Incision and Drainage Date/Time: 02/22/2016 1:03 AM Performed by: Lajean SilviusE VILLIER II, Maureen Duesing F Authorized by: Lajean SilviusE VILLIER II, Tristyn Pharris F   Consent:    Consent obtained:  Verbal   Consent given by:  Patient   Risks discussed:  Incomplete drainage, pain and infection   Alternatives discussed:  No treatment Location:    Type:  Abscess   Size:  2 cm   Location:  Lower extremity   Lower extremity location:  Hip   Hip location:  L hip Pre-procedure details:    Skin preparation:  Betadine Anesthesia (see MAR for exact dosages):    Anesthesia method:  Local infiltration    Local anesthetic:  Lidocaine 2% WITH epi Procedure type:    Complexity:  Simple Procedure details:    Incision types:  Stab incision   Incision depth:  Dermal   Scalpel blade:  11   Wound management:  Probed and deloculated   Drainage:  Bloody and purulent   Drainage amount:  Moderate   Wound treatment:  Wound left open   Packing materials:  None Post-procedure details:    Patient tolerance of procedure:  Tolerated well, no immediate complications   (including critical care time)  Medications Ordered in ED Medications  lidocaine-EPINEPHrine (XYLOCAINE W/EPI) 2 %-1:200000 (PF) injection 20 mL (20 mLs Infiltration Given 02/22/16 0017)     Initial Impression / Assessment and Plan / ED Course  I have reviewed the triage vital signs and the nursing notes.  Pertinent labs & imaging results that were available during my care of the patient were reviewed by me and considered in my medical decision making (see chart for details).  Clinical Course    Patient is 30 yo M presenting with painful abscess to left hip. I&D performed, and patient tolerated procedure well. Given the small surrounding area of cellulitis, patient started on empiric doxycycline, and nursing marked the area with surgical pen. Advised to complete full course of doxy, take ibuprofen as needed for pain, and [facility]. Wound care and proper hygiene discussed, and return precautions for worsening pain, fever, warmth, redness, or swelling outside of the marked area on his hip.  Final Clinical Impressions(s) / ED Diagnoses   Final diagnoses:  Abscess  Cellulitis of other specified site    New Prescriptions Discharge Medication List as of 02/22/2016 12:57 AM    START taking these medications   Details  doxycycline (VIBRAMYCIN) 100 MG capsule Take 1 capsule (100 mg total) by mouth 2 (two) times daily., Starting Thu 02/22/2016, Print    ibuprofen (ADVIL,MOTRIN) 800 MG tablet Take 1  tablet (800 mg total) by mouth 3 (three) times daily., Starting Thu 02/22/2016, Print         Melodi Happel F de HoopleVillier II, [place] 02/23/16 [id number]    Doug SouSam Jacubowitz, MD 03/04/16 [phone]

## 2016-02-22 NOTE — ED Notes (Signed)
I&D tray with lidocaine at bedside awaiting EDP

## 2017-02-25 ENCOUNTER — Encounter: Payer: Self-pay | Admitting: Urgent Care

## 2017-02-25 ENCOUNTER — Ambulatory Visit (INDEPENDENT_AMBULATORY_CARE_PROVIDER_SITE_OTHER): Payer: 59

## 2017-02-25 ENCOUNTER — Ambulatory Visit: Payer: 59 | Admitting: Urgent Care

## 2017-02-25 VITALS — BP 184/120 | HR 86 | Temp 98.3°F | Resp 18 | Ht 74.0 in | Wt 263.6 lb

## 2017-02-25 DIAGNOSIS — M25562 Pain in left knee: Secondary | ICD-10-CM

## 2017-02-25 DIAGNOSIS — R030 Elevated blood-pressure reading, without diagnosis of hypertension: Secondary | ICD-10-CM | POA: Diagnosis not present

## 2017-02-25 DIAGNOSIS — F17200 Nicotine dependence, unspecified, uncomplicated: Secondary | ICD-10-CM | POA: Diagnosis not present

## 2017-02-25 DIAGNOSIS — I10 Essential (primary) hypertension: Secondary | ICD-10-CM | POA: Diagnosis not present

## 2017-02-25 MED ORDER — TRAMADOL-ACETAMINOPHEN 37.5-325 MG PO TABS: 1.0000 | ORAL_TABLET | Freq: Two times a day (BID) | ORAL | 0 refills | Status: DC | PRN

## 2017-02-25 MED ORDER — AMLODIPINE BESYLATE 5 MG PO TABS: 5.0000 mg | ORAL_TABLET | Freq: Every day | ORAL | 0 refills | Status: DC
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
## 2017-02-26 LAB — COMPREHENSIVE METABOLIC PANEL
A/G RATIO: 1.5 (ref 1.2–2.2)
ALBUMIN: 4.6 g/dL (ref 3.5–5.5)
ALT: 26 IU/L (ref 0–44)
AST: 25 IU/L (ref 0–40)
Alkaline Phosphatase: 76 IU/L (ref 39–117)
BUN / CREAT RATIO: 6 — AB (ref 9–20)
BUN: 6 mg/dL (ref 6–20)
Bilirubin Total: 1.1 mg/dL (ref 0.0–1.2)
CALCIUM: 9.6 mg/dL (ref 8.7–10.2)
CO2: 24 mmol/L (ref 20–29)
CREATININE: 1.09 mg/dL (ref 0.76–1.27)
Chloride: 103 mmol/L (ref 96–106)
GFR, EST AFRICAN AMERICAN: 103 mL/min/{1.73_m2} (ref >59)
GFR, EST NON AFRICAN AMERICAN: 89 mL/min/{1.73_m2} (ref >59)
GLOBULIN, TOTAL: 3 g/dL (ref 1.5–4.5)
Glucose: 85 mg/dL (ref 65–99)
POTASSIUM: 4.2 mmol/L (ref 3.5–5.2)
SODIUM: 141 mmol/L (ref 134–144)
Total Protein: 7.6 g/dL (ref 6.0–8.5)

## 2017-03-04 ENCOUNTER — Encounter: Payer: Self-pay | Admitting: Urgent Care

## 2017-03-05 ENCOUNTER — Encounter: Payer: Self-pay | Admitting: Urgent Care

## 2017-07-14 ENCOUNTER — Encounter (HOSPITAL_COMMUNITY): Payer: Self-pay | Admitting: Emergency Medicine

## 2017-07-14 DIAGNOSIS — Z5321 Procedure and treatment not carried out due to patient leaving prior to being seen by health care provider: Secondary | ICD-10-CM | POA: Insufficient documentation

## 2017-07-14 DIAGNOSIS — R55 Syncope and collapse: Secondary | ICD-10-CM | POA: Insufficient documentation

## 2017-07-14 LAB — BASIC METABOLIC PANEL
Anion gap: 9 (ref 5–15)
BUN: 10 mg/dL (ref 6–20)
CHLORIDE: 105 mmol/L (ref 101–111)
CO2: 25 mmol/L (ref 22–32)
Calcium: 8.9 mg/dL (ref 8.9–10.3)
Creatinine, Ser: 1.06 mg/dL (ref 0.61–1.24)
GFR calc non Af Amer: >60 mL/min (ref >60)
Glucose, Bld: 78 mg/dL (ref 65–99)
POTASSIUM: 3.8 mmol/L (ref 3.5–5.1)
SODIUM: 139 mmol/L (ref 135–145)

## 2017-07-14 LAB — CBC
HEMATOCRIT: 43.6 % (ref 39.0–52.0)
Hemoglobin: 14.3 g/dL (ref 13.0–17.0)
MCH: 28.7 pg (ref 26.0–34.0)
MCHC: 32.8 g/dL (ref 30.0–36.0)
MCV: 87.4 fL (ref 78.0–100.0)
Platelets: 254 10*3/uL (ref 150–400)
RBC: 4.99 MIL/uL (ref 4.22–5.81)
RDW: 12.4 % (ref 11.5–15.5)
WBC: 11.8 10*3/uL — AB (ref 4.0–10.5)
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
## 2017-07-14 NOTE — ED Notes (Signed)
Called Pt in lobby for vital recheck, no response in lobby x1. 

## 2017-07-14 NOTE — ED Notes (Signed)
Pt called from the lobby with no response x2 

## 2017-07-15 ENCOUNTER — Emergency Department (HOSPITAL_COMMUNITY)
Admission: EM | Admit: 2017-07-15 | Discharge: 2017-07-15 | Disposition: A | Discharge status: Home / Self Care | Payer: BLUE CROSS/BLUE SHIELD | Attending: Emergency Medicine | Admitting: Emergency Medicine

## 2017-07-15 ENCOUNTER — Emergency Department (HOSPITAL_COMMUNITY): Payer: Self-pay

## 2017-07-15 ENCOUNTER — Emergency Department (HOSPITAL_COMMUNITY)
Admission: EM | Admit: 2017-07-15 | Discharge: 2017-07-15 | Disposition: A | Discharge status: Home / Self Care | Payer: Self-pay | Attending: Emergency Medicine | Admitting: Emergency Medicine

## 2017-07-15 ENCOUNTER — Encounter (HOSPITAL_COMMUNITY): Payer: Self-pay | Admitting: Emergency Medicine

## 2017-07-15 DIAGNOSIS — F17210 Nicotine dependence, cigarettes, uncomplicated: Secondary | ICD-10-CM | POA: Insufficient documentation

## 2017-07-15 DIAGNOSIS — R55 Syncope and collapse: Secondary | ICD-10-CM | POA: Insufficient documentation

## 2017-07-15 DIAGNOSIS — Z79899 Other long term (current) drug therapy: Secondary | ICD-10-CM | POA: Insufficient documentation

## 2017-07-15 LAB — CBG MONITORING, ED: Glucose-Capillary: 110 mg/dL — ABNORMAL HIGH (ref 65–99)

## 2017-07-15 MED ORDER — SODIUM CHLORIDE 0.9 % IV SOLN: INTRAVENOUS | Status: DC

## 2017-07-15 MED ORDER — SODIUM CHLORIDE 0.9 % IV BOLUS: 1000.0000 mL | Freq: Once | INTRAVENOUS | Status: DC
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
## 2017-07-15 NOTE — ED Notes (Addendum)
During IV attempt Pt refused IV access.

## 2017-07-15 NOTE — ED Triage Notes (Signed)
pt reports that past out again this morning. Reports did same thing yesterday. Came but left before being seen.

## 2017-07-19 IMAGING — CR DG CHEST 2V
2 series · 2 of 2 positions shown · non-contrast
Comparison: Prior study from 04/14/2014.

CLINICAL DATA: Initial evaluation for acute right anterior chest
pain for 1 week.

EXAM:
CHEST  2 VIEW

[w chest pa]
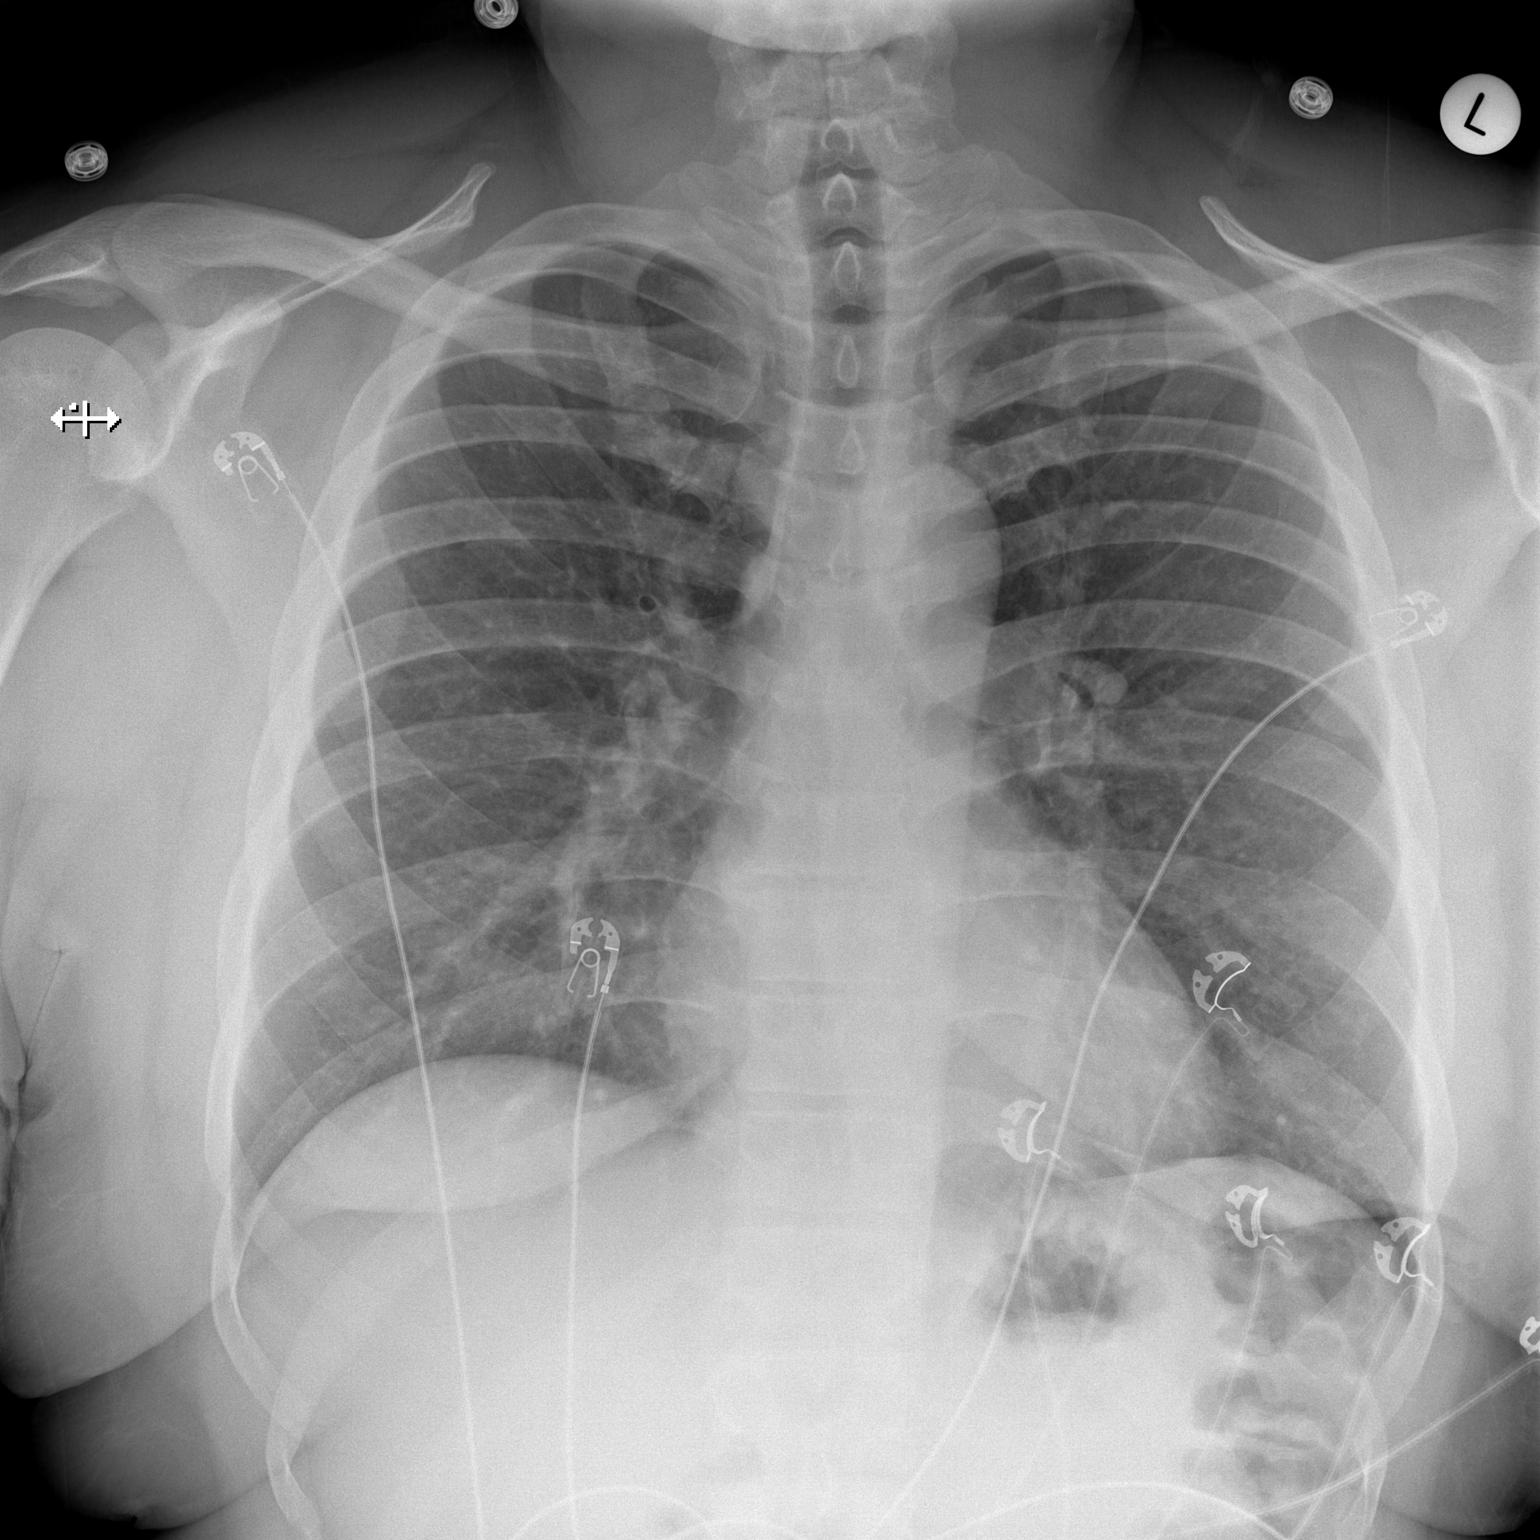

[w chest lat]
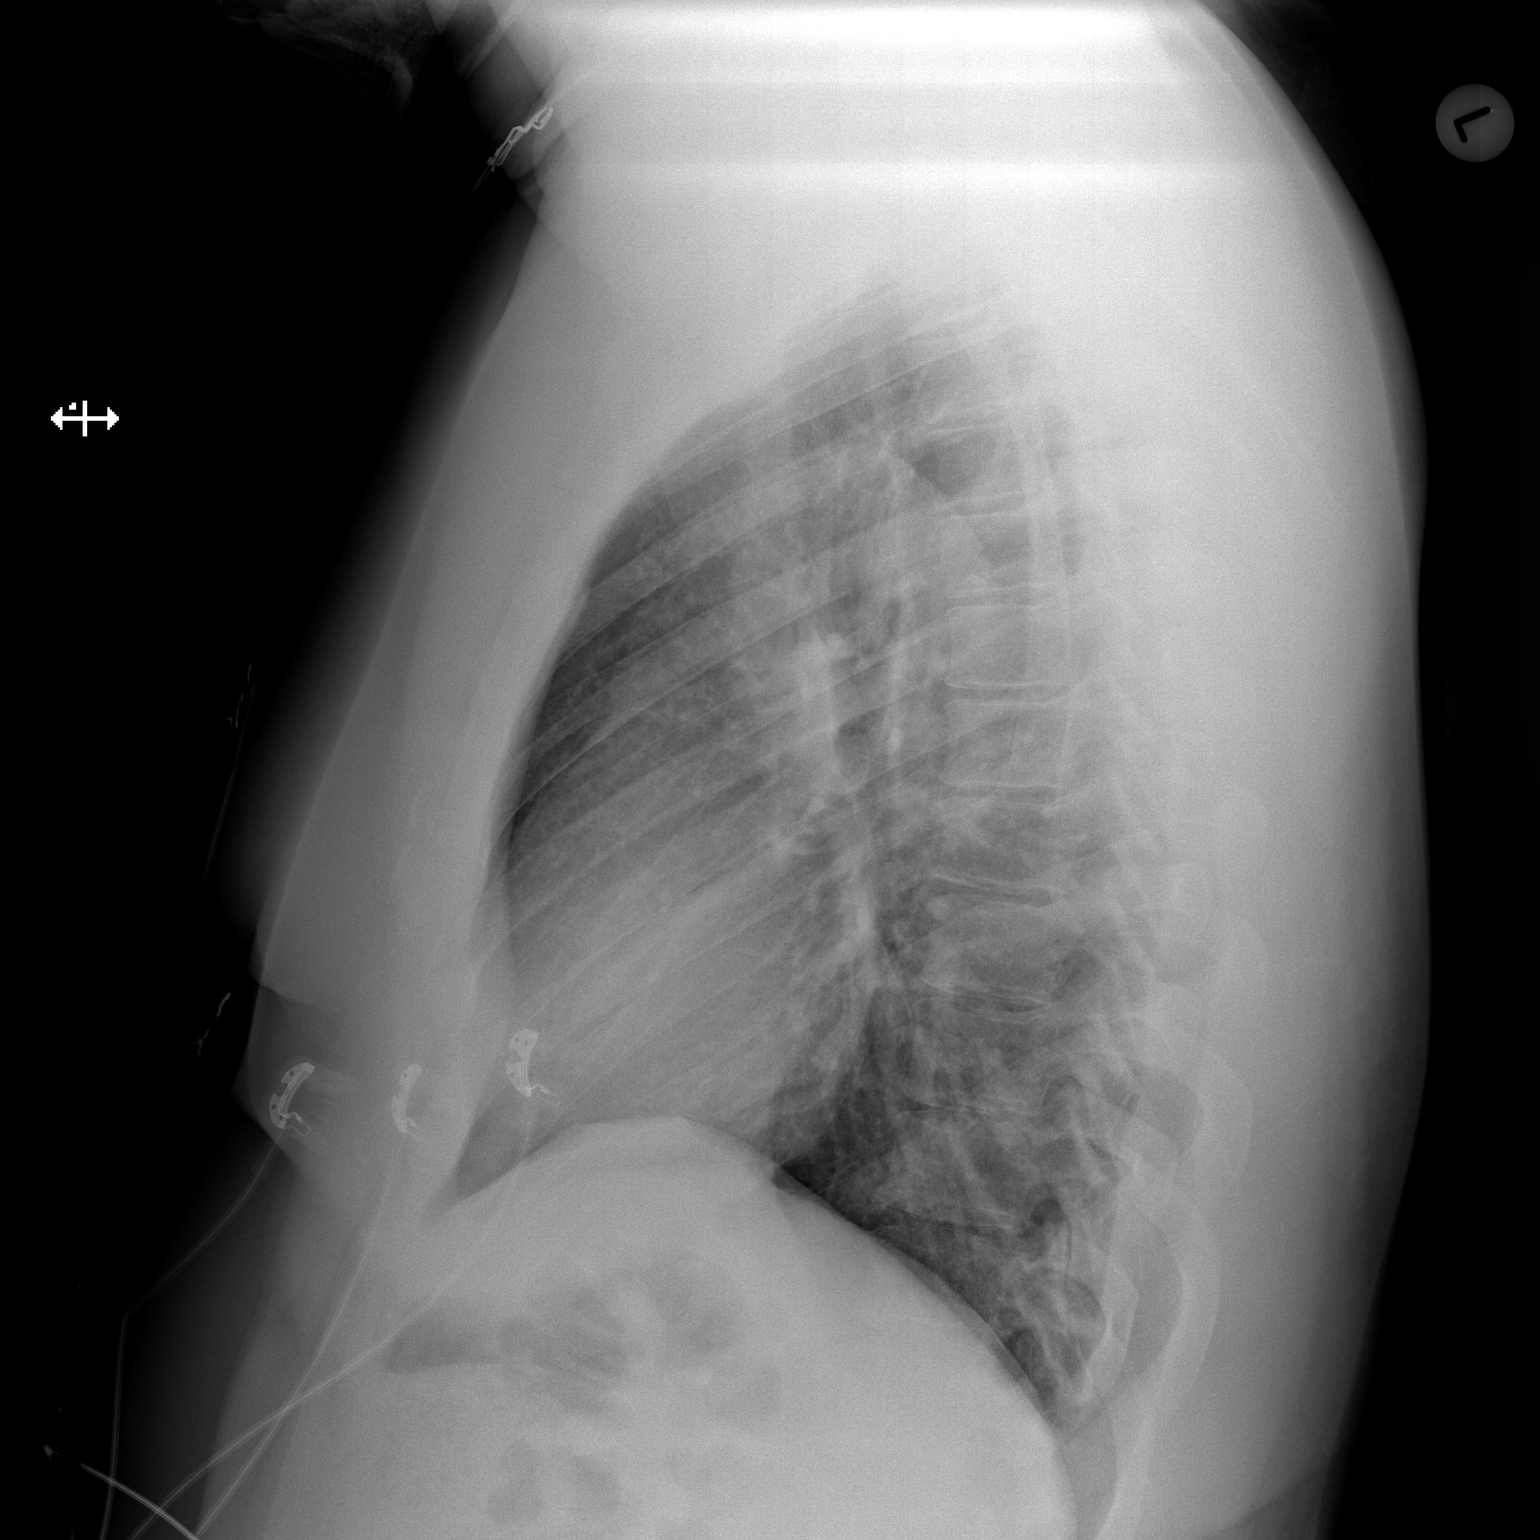

[2 of 2 positions shown; findings below may reference images not displayed]

FINDINGS: The cardiac and mediastinal silhouettes are stable in size and
contour, and remain within normal limits.

The lungs are normally inflated. Mild diffuse bronchitic changes,
similar to prior, and likely chronic. No airspace consolidation,
pleural effusion, or pulmonary edema is identified. There is no
pneumothorax.

No acute osseous abnormality identified.
IMPRESSION: 1. No active cardiopulmonary disease.
2. Mild diffuse bronchitic changes, similar to prior.

## 2017-09-26 ENCOUNTER — Emergency Department (HOSPITAL_COMMUNITY)
Admission: EM | Admit: 2017-09-26 | Discharge: 2017-09-26 | Disposition: A | Discharge status: Home / Self Care | Payer: Self-pay | Attending: Emergency Medicine | Admitting: Emergency Medicine

## 2017-09-26 ENCOUNTER — Emergency Department (HOSPITAL_COMMUNITY)
Admission: EM | Admit: 2017-09-26 | Discharge: 2017-09-27 | Disposition: A | Discharge status: Home / Self Care | Payer: Self-pay | Attending: Emergency Medicine | Admitting: Emergency Medicine

## 2017-09-26 ENCOUNTER — Encounter (HOSPITAL_COMMUNITY): Payer: Self-pay | Admitting: Emergency Medicine

## 2017-09-26 ENCOUNTER — Other Ambulatory Visit: Payer: Self-pay

## 2017-09-26 DIAGNOSIS — J029 Acute pharyngitis, unspecified: Secondary | ICD-10-CM | POA: Insufficient documentation

## 2017-09-26 DIAGNOSIS — Z5321 Procedure and treatment not carried out due to patient leaving prior to being seen by health care provider: Secondary | ICD-10-CM | POA: Insufficient documentation

## 2017-09-26 LAB — GROUP A STREP BY PCR
GROUP A STREP BY PCR: NOT DETECTED
GROUP A STREP BY PCR: NOT DETECTED

## 2017-09-26 NOTE — ED Triage Notes (Signed)
C/o sore throat since earlier today.

## 2017-09-26 NOTE — ED Notes (Signed)
Patient left after triage

## 2019-03-04 ENCOUNTER — Other Ambulatory Visit: Payer: Self-pay

## 2019-03-04 DIAGNOSIS — Z20822 Contact with and (suspected) exposure to covid-19: Secondary | ICD-10-CM

## 2019-03-07 LAB — NOVEL CORONAVIRUS, NAA: SARS-CoV-2, NAA: NOT DETECTED

## 2019-04-21 ENCOUNTER — Ambulatory Visit: Payer: 59 | Attending: Internal Medicine

## 2019-04-21 DIAGNOSIS — Z20822 Contact with and (suspected) exposure to covid-19: Secondary | ICD-10-CM

## 2019-04-22 LAB — NOVEL CORONAVIRUS, NAA: SARS-CoV-2, NAA: NOT DETECTED

## 2020-07-13 ENCOUNTER — Emergency Department (HOSPITAL_COMMUNITY): Payer: Managed Care, Other (non HMO)

## 2020-07-13 ENCOUNTER — Observation Stay (HOSPITAL_COMMUNITY)
Admission: EM | Admit: 2020-07-13 | Discharge: 2020-07-14 | Disposition: A | Discharge status: Home / Self Care | Payer: Managed Care, Other (non HMO) | Attending: Family Medicine | Admitting: Family Medicine

## 2020-07-13 ENCOUNTER — Encounter (HOSPITAL_COMMUNITY): Payer: Self-pay

## 2020-07-13 ENCOUNTER — Other Ambulatory Visit: Payer: Self-pay

## 2020-07-13 DIAGNOSIS — J188 Other pneumonia, unspecified organism: Principal | ICD-10-CM | POA: Insufficient documentation

## 2020-07-13 DIAGNOSIS — E876 Hypokalemia: Secondary | ICD-10-CM | POA: Insufficient documentation

## 2020-07-13 DIAGNOSIS — R0602 Shortness of breath: Secondary | ICD-10-CM | POA: Diagnosis not present

## 2020-07-13 DIAGNOSIS — Z79899 Other long term (current) drug therapy: Secondary | ICD-10-CM | POA: Diagnosis not present

## 2020-07-13 DIAGNOSIS — F17210 Nicotine dependence, cigarettes, uncomplicated: Secondary | ICD-10-CM | POA: Insufficient documentation

## 2020-07-13 DIAGNOSIS — Z20822 Contact with and (suspected) exposure to covid-19: Secondary | ICD-10-CM | POA: Diagnosis not present

## 2020-07-13 DIAGNOSIS — I10 Essential (primary) hypertension: Secondary | ICD-10-CM | POA: Insufficient documentation

## 2020-07-13 DIAGNOSIS — R6510 Systemic inflammatory response syndrome (SIRS) of non-infectious origin without acute organ dysfunction: Secondary | ICD-10-CM

## 2020-07-13 DIAGNOSIS — R0789 Other chest pain: Secondary | ICD-10-CM | POA: Diagnosis present

## 2020-07-13 DIAGNOSIS — J189 Pneumonia, unspecified organism: Secondary | ICD-10-CM

## 2020-07-13 HISTORY — DX: Essential (primary) hypertension: I10

## 2020-07-13 LAB — BASIC METABOLIC PANEL
Anion gap: 10 (ref 5–15)
BUN: 10 mg/dL (ref 6–20)
CO2: 26 mmol/L (ref 22–32)
Calcium: 8.3 mg/dL — ABNORMAL LOW (ref 8.9–10.3)
Chloride: 99 mmol/L (ref 98–111)
Creatinine, Ser: 1.13 mg/dL (ref 0.61–1.24)
GFR, Estimated: >60 mL/min (ref >60)
Glucose, Bld: 102 mg/dL — ABNORMAL HIGH (ref 70–99)
Potassium: 3.4 mmol/L — ABNORMAL LOW (ref 3.5–5.1)
Sodium: 135 mmol/L (ref 135–145)

## 2020-07-13 LAB — CBC
HCT: 41.8 % (ref 39.0–52.0)
Hemoglobin: 13.7 g/dL (ref 13.0–17.0)
MCH: 28.2 pg (ref 26.0–34.0)
MCHC: 32.8 g/dL (ref 30.0–36.0)
MCV: 86 fL (ref 80.0–100.0)
Platelets: 214 10*3/uL (ref 150–400)
RBC: 4.86 MIL/uL (ref 4.22–5.81)
RDW: 11.9 % (ref 11.5–15.5)
WBC: 15.7 10*3/uL — ABNORMAL HIGH (ref 4.0–10.5)
nRBC: 0 % (ref 0.0–0.2)

## 2020-07-13 LAB — I-STAT CHEM 8, ED
BUN: 9 mg/dL (ref 6–20)
Calcium, Ion: 1.15 mmol/L (ref 1.15–1.40)
Chloride: 100 mmol/L (ref 98–111)
Creatinine, Ser: 1.1 mg/dL (ref 0.61–1.24)
Glucose, Bld: 99 mg/dL (ref 70–99)
HCT: 41 % (ref 39.0–52.0)
Hemoglobin: 13.9 g/dL (ref 13.0–17.0)
Potassium: 3.5 mmol/L (ref 3.5–5.1)
Sodium: 139 mmol/L (ref 135–145)
TCO2: 28 mmol/L (ref 22–32)

## 2020-07-13 LAB — CREATININE, SERUM
Creatinine, Ser: 1 mg/dL (ref 0.61–1.24)
GFR, Estimated: >60 mL/min (ref >60)

## 2020-07-13 LAB — TROPONIN I (HIGH SENSITIVITY)
Troponin I (High Sensitivity): 8 ng/L (ref <18)
Troponin I (High Sensitivity): 9 ng/L (ref <18)

## 2020-07-13 LAB — LACTIC ACID, PLASMA: Lactic Acid, Venous: 0.7 mmol/L (ref 0.5–1.9)

## 2020-07-13 LAB — RESP PANEL BY RT-PCR (FLU A&B, COVID) ARPGX2
Influenza A by PCR: NEGATIVE
Influenza B by PCR: NEGATIVE
SARS Coronavirus 2 by RT PCR: NEGATIVE

## 2020-07-13 LAB — HIV ANTIBODY (ROUTINE TESTING W REFLEX): HIV Screen 4th Generation wRfx: NONREACTIVE

## 2020-07-13 LAB — BRAIN NATRIURETIC PEPTIDE: B Natriuretic Peptide: 34.4 pg/mL (ref 0.0–100.0)

## 2020-07-13 LAB — MAGNESIUM: Magnesium: 2 mg/dL (ref 1.7–2.4)

## 2020-07-13 MED ORDER — HYDRALAZINE HCL 20 MG/ML IJ SOLN
10.0000 mg | Freq: Three times a day (TID) | INTRAMUSCULAR | Status: DC | PRN
  Administered 2020-07-13: 10 mg via INTRAVENOUS
  Filled 2020-07-13: qty 1

## 2020-07-13 MED ORDER — SODIUM CHLORIDE 0.9 % IV SOLN
500.0000 mg | Freq: Once | INTRAVENOUS | Status: AC
  Administered 2020-07-13: 500 mg via INTRAVENOUS
  Filled 2020-07-13: qty 500

## 2020-07-13 MED ORDER — ONDANSETRON HCL 4 MG/2ML IJ SOLN: 4.0000 mg | Freq: Four times a day (QID) | INTRAMUSCULAR | Status: DC | PRN

## 2020-07-13 MED ORDER — IPRATROPIUM-ALBUTEROL 0.5-2.5 (3) MG/3ML IN SOLN
3.0000 mL | Freq: Three times a day (TID) | RESPIRATORY_TRACT | Status: DC
  Administered 2020-07-14: 3 mL via RESPIRATORY_TRACT
  Filled 2020-07-13: qty 3

## 2020-07-13 MED ORDER — ACETAMINOPHEN 500 MG PO TABS
1000.0000 mg | ORAL_TABLET | Freq: Once | ORAL | Status: AC
  Administered 2020-07-13: 1000 mg via ORAL
  Filled 2020-07-13: qty 2

## 2020-07-13 MED ORDER — ALBUTEROL SULFATE (2.5 MG/3ML) 0.083% IN NEBU
2.5000 mg | INHALATION_SOLUTION | RESPIRATORY_TRACT | Status: DC | PRN
  Administered 2020-07-13: 2.5 mg via RESPIRATORY_TRACT
  Filled 2020-07-13: qty 3

## 2020-07-13 MED ORDER — SODIUM CHLORIDE 0.9 % IV SOLN
1.0000 g | Freq: Once | INTRAVENOUS | Status: AC
  Administered 2020-07-13: 1 g via INTRAVENOUS
  Filled 2020-07-13: qty 10

## 2020-07-13 MED ORDER — IOHEXOL 350 MG/ML SOLN
100.0000 mL | Freq: Once | INTRAVENOUS | Status: AC | PRN
  Administered 2020-07-13: 100 mL via INTRAVENOUS

## 2020-07-13 MED ORDER — AZITHROMYCIN 250 MG PO TABS
500.0000 mg | ORAL_TABLET | Freq: Every day | ORAL | Status: DC
  Administered 2020-07-14: 500 mg via ORAL
  Filled 2020-07-13: qty 2

## 2020-07-13 MED ORDER — DILTIAZEM HCL ER COATED BEADS 240 MG PO CP24
240.0000 mg | ORAL_CAPSULE | Freq: Every day | ORAL | Status: DC
  Administered 2020-07-13 – 2020-07-14 (×2): 240 mg via ORAL
  Filled 2020-07-13 (×2): qty 1

## 2020-07-13 MED ORDER — ENOXAPARIN SODIUM 40 MG/0.4ML ~~LOC~~ SOLN
40.0000 mg | SUBCUTANEOUS | Status: DC
  Administered 2020-07-13: 40 mg via SUBCUTANEOUS
  Filled 2020-07-13: qty 0.4

## 2020-07-13 MED ORDER — ONDANSETRON HCL 4 MG PO TABS: 4.0000 mg | ORAL_TABLET | Freq: Four times a day (QID) | ORAL | Status: DC | PRN

## 2020-07-13 MED ORDER — LACTATED RINGERS IV SOLN: Freq: Once | INTRAVENOUS | Status: AC

## 2020-07-13 MED ORDER — IBUPROFEN 200 MG PO TABS
600.0000 mg | ORAL_TABLET | Freq: Three times a day (TID) | ORAL | Status: AC
  Administered 2020-07-13 (×3): 600 mg via ORAL
  Filled 2020-07-13 (×3): qty 3

## 2020-07-13 MED ORDER — POTASSIUM CHLORIDE 20 MEQ PO PACK
20.0000 meq | PACK | Freq: Two times a day (BID) | ORAL | Status: AC
  Administered 2020-07-13 (×2): 20 meq via ORAL
  Filled 2020-07-13 (×2): qty 1

## 2020-07-13 MED ORDER — SODIUM CHLORIDE 0.9 % IV SOLN
2.0000 g | INTRAVENOUS | Status: DC
  Administered 2020-07-14: 2 g via INTRAVENOUS
  Filled 2020-07-13: qty 2

## 2020-07-13 MED ORDER — LACTATED RINGERS IV SOLN: INTRAVENOUS | Status: DC

## 2020-07-13 MED ORDER — GUAIFENESIN ER 600 MG PO TB12
600.0000 mg | ORAL_TABLET | Freq: Two times a day (BID) | ORAL | Status: DC
  Administered 2020-07-13 – 2020-07-14 (×3): 600 mg via ORAL
  Filled 2020-07-13 (×3): qty 1

## 2020-07-13 MED ORDER — ACETAMINOPHEN 650 MG RE SUPP: 650.0000 mg | Freq: Four times a day (QID) | RECTAL | Status: DC | PRN

## 2020-07-13 MED ORDER — ALBUTEROL SULFATE (2.5 MG/3ML) 0.083% IN NEBU
2.5000 mg | INHALATION_SOLUTION | Freq: Once | RESPIRATORY_TRACT | Status: AC
  Administered 2020-07-13: 2.5 mg via RESPIRATORY_TRACT
  Filled 2020-07-13: qty 3

## 2020-07-13 MED ORDER — ACETAMINOPHEN 325 MG PO TABS: 650.0000 mg | ORAL_TABLET | Freq: Four times a day (QID) | ORAL | Status: DC | PRN

## 2020-07-13 NOTE — H&P (Signed)
History and Physical    Hunter Koch YKZ:[id number] DB: November 18, 1985 DOA: 07/13/2020  PCP: Hunter Heck, MD  Patient coming from: home  Chief Complaint: chest tightness, shortness  HPI: Hunter Koch is a 35 y.o. male with medical history significant of HTN. Presenting with chest tightness and shortness of breath. He reports that his symptoms started with chills and fevers 6 days ago. He tried Catering manager, mucinex and robitussin. However, they did not help. His symptoms progressed to non-productive cough and poor appetite over the weekend. Finally last night, he had left side chest tightness and dyspnea around 2100hrs. He became concerned and came to the ED. He denies any other alleviating or aggravating factors.   ED Course: COVID, EKG, trp were all negative. Suboptimal CTA chest did not reveal at pulm embolism. However, it did show multifocal PNA. He was started on CAP coverage. TRH was called for admission.   Review of Systems: Review of systems is otherwise negative for all not mentioned in HPI.   PMHx Past Medical History:  Diagnosis Date  . Hypertension   . No significant past medical history     PSHx Past Surgical History:  Procedure Laterality Date  . ABDOMINAL SURGERY      SocHx  reports that he has been smoking cigarettes. He has been smoking about 0.50 packs per day. He has never used smokeless tobacco. He reports current alcohol use of about 7.0 standard drinks of alcohol per week. He reports that he does not use drugs.  Allergies  Allergen Reactions  . Lisinopril     Other reaction(s): Cough    FamHx Family History  Problem Relation Age of Onset  . Hypertension Mother   . Diabetes Father     Prior to Admission medications   Medication Sig Start Date End Date Taking? Authorizing Provider  diltiazem (CARDIZEM CD) 240 MG 24 hr capsule Take 240 mg by mouth daily. 07/12/20  Yes [provider]    Physical Exam: Vitals:   07/13/20 0325  07/13/20 0430 07/13/20 0515 07/13/20 0545  BP: (!) 159/110 (!) 141/92 (!) 169/102 (!) 182/124  Pulse: 95 89 94 (!) 111  Resp: (!) 21 11 20 14   Temp: 99.6 F (37.6 C)   99.1 F (37.3 C)  TempSrc: Oral   Oral  SpO2: 95% 95% 96% 90%  Weight: 117.9 kg     Height: 6\' 2"  (1.88 m)       General: 35 y.o. male resting in bed in NAD Eyes: PERRL, normal sclera ENMT: Nares patent w/o discharge, orophaynx clear, dentition normal, ears w/o discharge/lesions/ulcers Neck: Supple, trachea midline Cardiovascular: RRR, +S1, S2, no m/g/r, equal pulses throughout; reproducible left pain Respiratory: scattered rhonchi, somewhat increased WOB on RA GI: BS+, NDNT, no masses noted, no organomegaly noted MSK: No e/c/c Skin: No rashes, bruises, ulcerations noted Neuro: A&O x 3, no focal deficits Psyc: Appropriate interaction and affect, calm/cooperative  Labs on Admission: I have personally reviewed following labs and imaging studies  CBC: Recent Labs  Lab 07/13/20 0423 07/13/20 0432  WBC 15.7*  --   HGB 13.7 13.9  HCT 41.8 41.0  MCV 86.0  --   PLT 214  --    Basic Metabolic Panel: Recent Labs  Lab 07/13/20 0423 07/13/20 0432  NA 135 139  K 3.4* 3.5  CL 99 100  CO2 26  --   GLUCOSE 102* 99  BUN 10 9  CREATININE 1.13 1.10  CALCIUM 8.3*  --  GFR: Estimated Creatinine Clearance: 126.7 mL/min (by C-G formula based on SCr of 1.1 mg/dL). Liver Function Tests: No results for input(s): AST, ALT, ALKPHOS, BILITOT, PROT, ALBUMIN in the last 168 hours. No results for input(s): LIPASE, AMYLASE in the last 168 hours. No results for input(s): AMMONIA in the last 168 hours. Coagulation Profile: No results for input(s): INR, PROTIME in the last 168 hours. Cardiac Enzymes: No results for input(s): CKTOTAL, CKMB, CKMBINDEX, TROPONINI in the last 168 hours. BNP (last 3 results) No results for input(s): PROBNP in the last 8760 hours. HbA1C: No results for input(s): HGBA1C in the last 72  hours. CBG: No results for input(s): GLUCAP in the last 168 hours. Lipid Profile: No results for input(s): CHOL, HDL, LDLCALC, TRIG, CHOLHDL, LDLDIRECT in the last 72 hours. Thyroid Function Tests: No results for input(s): TSH, T4TOTAL, FREET4, T3FREE, THYROIDAB in the last 72 hours. Anemia Panel: No results for input(s): VITAMINB12, FOLATE, FERRITIN, TIBC, IRON, RETICCTPCT in the last 72 hours. Urine analysis:    Component Value Date/Time   BILIRUBINUR negative 10/27/2015 0939   KETONESUR negative 10/27/2015 0939   PROTEINUR negative 10/27/2015 0939   UROBILINOGEN 1.0 10/27/2015 0939   NITRITE Negative 10/27/2015 0939   LEUKOCYTESUR Negative 10/27/2015 0939    Radiological Exams on Admission: DG Chest 2 View  Result Date: 07/13/2020 CLINICAL DATA:  Chest pain, dyspnea EXAM: CHEST - 2 VIEW COMPARISON:  07/15/2017 FINDINGS: The heart size and mediastinal contours are within normal limits. Both lungs are clear. The visualized skeletal structures are unremarkable. IMPRESSION: No active cardiopulmonary disease. Electronically Signed   By: Helyn Numbers MD   On: 07/13/2020 04:02   CT Angio Chest PE W and/or Wo Contrast  Result Date: 07/13/2020 CLINICAL DATA:  Chest pain EXAM: CT ANGIOGRAPHY CHEST WITH CONTRAST TECHNIQUE: Multidetector CT imaging of the chest was performed using the standard protocol during bolus administration of intravenous contrast. Multiplanar CT image reconstructions and MIPs were obtained to evaluate the vascular anatomy. CONTRAST:  OMNIPAQUE IOHEXOL 350 MG/ML SOLN COMPARISON:  None. FINDINGS: Cardiovascular: Bolus opacification of the pulmonary arterial tree is suboptimal and the examination is adequate only for exclusion of central and lobar intraluminal filling defects, of which there are none. The segmental and subsegmental pulmonary arteries are not well opacified to definitively exclude the presence of small pulmonary embolism. The central pulmonary arteries  are of normal caliber. Cardiac size is within normal limits. No significant coronary artery calcification. No pericardial effusion. The thoracic aorta is unremarkable. Mediastinum/Nodes: No enlarged mediastinal, hilar, or axillary lymph nodes. Thyroid gland, trachea, and esophagus demonstrate no significant findings. Lungs/Pleura: Nodular infiltrate is seen within the right upper lobe and, to a lesser extent within the left upper lobe and lower lobes, left greater than right. There is associated bronchial wall thickening in keeping with airway inflammation. Together, the findings are most in keeping with atypical infection in the acute setting. No central obstructing lesion. No pneumothorax or pleural effusion. Upper Abdomen: No acute abnormality within the visualized upper abdomen Musculoskeletal: No acute bone abnormality. No lytic or blastic bone lesion. Review of the MIP images confirms the above findings. IMPRESSION: Suboptimal opacification of the a pulmonary arterial tree. No evidence of large central pulmonary embolism. The segmental and subsegmental pulmonary arteries are not well assessed. Multifocal pulmonary infiltrates and associated airway inflammation most in keeping with acute atypical infection. Electronically Signed   By: Helyn Numbers MD   On: 07/13/2020 05:20    EKG: Independently reviewed. Sinus tach,  no st elevations  Assessment/Plan Multifocal PNA Pleuritic chest pain     - place in obs, tele     - continue rocephin, zithro     - add guaifenesin, PRN nebs     - EKG, trp are fine     - ibuprofen for CP     - can consider rpt CTA PE if symptoms are not improving on current regimen  HTN     - resume diltiazem     - PRN hydralazine available  Tobacco abuse     - counseled against further use  Hypokalemia     - replace K+; check Mg2+  DVT prophylaxis: lovenox  Code Status: FULL  Family Communication: none at bedside  Consults called: None   Status is:  Observation  The patient remains OBS appropriate and will d/c before 2 midnights.  Dispo: The patient is from: Home              Anticipated d/c is to: Home              Patient currently is not medically stable to d/c.   Difficult to place patient No  Time spent coordinating admission: 55 minutes  Kameo Bains A Pranika Finks DO [facility]  If 7PM-7AM, please contact night-coverage [email or web address]  07/13/2020, 7:57 AM

## 2020-07-13 NOTE — ED Provider Notes (Signed)
[facility]-EMERGENCY DEPT Provider Note   CSN: [id number] Arrival date & time: 07/13/20  3151     History Chief Complaint  Patient presents with  . Chest Pain    Hunter Koch is a 35 y.o. male.  The history is provided by the patient.  Chest Pain Pain location:  L chest Pain radiates to:  Does not radiate Pain severity:  Severe Onset quality:  Sudden Duration:  5 hours Timing:  Constant Progression:  Unchanged Chronicity:  New Context: at rest   Relieved by:  Nothing Worsened by:  Nothing Ineffective treatments:  None tried Associated symptoms: cough, fever and shortness of breath   Associated symptoms: no vomiting   Associated symptoms comment:  Subjective fevers and body aches.  SOB and these symptoms have been going on for several days.  Pain just started 5 hours PTA.  Is not covid vaccinated Risk factors: hypertension and male sex        Past Medical History:  Diagnosis Date  . Hypertension   . No significant past medical history     Patient Active Problem List   Diagnosis Date Noted  . Multifocal pneumonia 07/13/2020  . No significant past medical history     Past Surgical History:  Procedure Laterality Date  . ABDOMINAL SURGERY         Family History  Problem Relation Age of Onset  . Hypertension Mother   . Diabetes Father     Social History   Tobacco Use  . Smoking status: Current Every Day Smoker    Packs/day: 0.50    Types: Cigarettes  . Smokeless tobacco: Never Used  Vaping Use  . Vaping Use: Never used  Substance Use Topics  . Alcohol use: Yes    Alcohol/week: 7.0 standard drinks    Types: 7 Cans of beer per week    Comment: often  . Drug use: No    Home Medications Prior to Admission medications   Medication Sig Start Date End Date Taking? Authorizing Provider  diltiazem (CARDIZEM CD) 240 MG 24 hr capsule Take 240 mg by mouth daily. 07/12/20  Yes [provider]    Allergies     Lisinopril  Review of Systems   Review of Systems  Constitutional: Positive for fever.  HENT: Negative for facial swelling.   Eyes: Negative for visual disturbance.  Respiratory: Positive for cough and shortness of breath.   Cardiovascular: Positive for chest pain.  Gastrointestinal: Negative for vomiting.  Genitourinary: Negative for dysuria.  Musculoskeletal: Positive for myalgias.  Skin: Negative for rash.  Neurological: Negative for facial asymmetry.  Psychiatric/Behavioral: Negative for agitation.  All other systems reviewed and are negative.   Physical Exam Updated Vital Signs BP (!) 169/102   Pulse 94   Temp 99.6 F (37.6 C) (Oral)   Resp 20   Ht 6\' 2"  (1.88 m)   Wt 117.9 kg   SpO2 96%   BMI 33.38 kg/m   Physical Exam Vitals and nursing note reviewed.  Constitutional:      Appearance: Normal appearance. He is not diaphoretic.  HENT:     Head: Normocephalic and atraumatic.     Nose: Nose normal.  Eyes:     Conjunctiva/sclera: Conjunctivae normal.     Pupils: Pupils are equal, round, and reactive to light.  Cardiovascular:     Rate and Rhythm: Regular rhythm. Tachycardia present.     Pulses: Normal pulses.     Heart sounds: Normal heart sounds.  Pulmonary:     Breath sounds: Wheezing and rales present.  Abdominal:     General: Abdomen is flat. Bowel sounds are normal.     Palpations: Abdomen is soft.     Tenderness: There is no abdominal tenderness. There is no guarding or rebound.  Musculoskeletal:        General: Normal range of motion.     Cervical back: Normal range of motion and neck supple.  Skin:    General: Skin is warm and dry.     Capillary Refill: Capillary refill takes less than 2 seconds.  Neurological:     General: No focal deficit present.     Mental Status: He is alert and oriented to person, place, and time.     Deep Tendon Reflexes: Reflexes normal.  Psychiatric:        Mood and Affect: Mood normal.        Behavior: Behavior  normal.     ED Results / Procedures / Treatments   Labs (all labs ordered are listed, but only abnormal results are displayed) Results for orders placed or performed during the hospital encounter of 07/13/20  Resp Panel by RT-PCR (Flu A&B, Covid) Nasopharyngeal Swab   Specimen: Nasopharyngeal Swab; Nasopharyngeal(NP) swabs in vial transport medium  Result Value Ref Range   SARS Coronavirus 2 by RT PCR NEGATIVE NEGATIVE   Influenza A by PCR NEGATIVE NEGATIVE   Influenza B by PCR NEGATIVE NEGATIVE  Basic metabolic panel  Result Value Ref Range   Sodium 135 135 - 145 mmol/L   Potassium 3.4 (L) 3.5 - 5.1 mmol/L   Chloride 99 98 - 111 mmol/L   CO2 26 22 - 32 mmol/L   Glucose, Bld 102 (H) 70 - 99 mg/dL   BUN 10 6 - 20 mg/dL   Creatinine, Ser 4.09 0.61 - 1.24 mg/dL   Calcium 8.3 (L) 8.9 - 10.3 mg/dL   GFR, Estimated >81 >19 mL/min   Anion gap 10 5 - 15  CBC  Result Value Ref Range   WBC 15.7 (H) 4.0 - 10.5 K/uL   RBC 4.86 4.22 - 5.81 MIL/uL   Hemoglobin 13.7 13.0 - 17.0 g/dL   HCT 14.7 82.9 - 56.2 %   MCV 86.0 80.0 - 100.0 fL   MCH 28.2 26.0 - 34.0 pg   MCHC 32.8 30.0 - 36.0 g/dL   RDW 13.0 86.5 - 78.4 %   Platelets 214 150 - 400 K/uL   nRBC 0.0 0.0 - 0.2 %  Brain natriuretic peptide  Result Value Ref Range   B Natriuretic Peptide 34.4 0.0 - 100.0 pg/mL  I-stat chem 8, ED (not at [facility] or [facility])  Result Value Ref Range   Sodium 139 135 - 145 mmol/L   Potassium 3.5 3.5 - 5.1 mmol/L   Chloride 100 98 - 111 mmol/L   BUN 9 6 - 20 mg/dL   Creatinine, Ser 6.96 0.61 - 1.24 mg/dL   Glucose, Bld 99 70 - 99 mg/dL   Calcium, Ion 2.95 2.84 - 1.40 mmol/L   TCO2 28 22 - 32 mmol/L   Hemoglobin 13.9 13.0 - 17.0 g/dL   HCT 13.2 44.0 - 10.2 %  Troponin I (High Sensitivity)  Result Value Ref Range   Troponin I (High Sensitivity) 8 <18 ng/L   DG Chest 2 View  Result Date: 07/13/2020 CLINICAL DATA:  Chest pain, dyspnea EXAM: CHEST - 2 VIEW COMPARISON:  07/15/2017 FINDINGS: The heart size  and mediastinal contours are within  normal limits. Both lungs are clear. The visualized skeletal structures are unremarkable. IMPRESSION: No active cardiopulmonary disease. Electronically Signed   By: Helyn NumbersAshesh  Parikh MD   On: 07/13/2020 04:02   CT Angio Chest PE W and/or Wo Contrast  Result Date: 07/13/2020 CLINICAL DATA:  Chest pain EXAM: CT ANGIOGRAPHY CHEST WITH CONTRAST TECHNIQUE: Multidetector CT imaging of the chest was performed using the standard protocol during bolus administration of intravenous contrast. Multiplanar CT image reconstructions and MIPs were obtained to evaluate the vascular anatomy. CONTRAST:  100mL OMNIPAQUE IOHEXOL 350 MG/ML SOLN COMPARISON:  None. FINDINGS: Cardiovascular: Bolus opacification of the pulmonary arterial tree is suboptimal and the examination is adequate only for exclusion of central and lobar intraluminal filling defects, of which there are none. The segmental and subsegmental pulmonary arteries are not well opacified to definitively exclude the presence of small pulmonary embolism. The central pulmonary arteries are of normal caliber. Cardiac size is within normal limits. No significant coronary artery calcification. No pericardial effusion. The thoracic aorta is unremarkable. Mediastinum/Nodes: No enlarged mediastinal, hilar, or axillary lymph nodes. Thyroid gland, trachea, and esophagus demonstrate no significant findings. Lungs/Pleura: Nodular infiltrate is seen within the right upper lobe and, to a lesser extent within the left upper lobe and lower lobes, left greater than right. There is associated bronchial wall thickening in keeping with airway inflammation. Together, the findings are most in keeping with atypical infection in the acute setting. No central obstructing lesion. No pneumothorax or pleural effusion. Upper Abdomen: No acute abnormality within the visualized upper abdomen Musculoskeletal: No acute bone abnormality. No lytic or blastic bone lesion.  Review of the MIP images confirms the above findings. IMPRESSION: Suboptimal opacification of the a pulmonary arterial tree. No evidence of large central pulmonary embolism. The segmental and subsegmental pulmonary arteries are not well assessed. Multifocal pulmonary infiltrates and associated airway inflammation most in keeping with acute atypical infection. Electronically Signed   By: Helyn NumbersAshesh  Parikh MD   On: 07/13/2020 05:20    EKG EKG Interpretation  Date/Time:  Thursday July 13 2020 03:23:56 EDT Ventricular Rate:  101 PR Interval:  173 QRS Duration: 96 QT Interval:  347 QTC Calculation: 450 R Axis:   60 Text Interpretation: Sinus tachycardia Probable left atrial enlargement Confirmed by Nicanor AlconPalumbo, Waldon Sheerin ([id number]) on 07/13/2020 3:48:52 AM   Radiology DG Chest 2 View  Result Date: 07/13/2020 CLINICAL DATA:  Chest pain, dyspnea EXAM: CHEST - 2 VIEW COMPARISON:  07/15/2017 FINDINGS: The heart size and mediastinal contours are within normal limits. Both lungs are clear. The visualized skeletal structures are unremarkable. IMPRESSION: No active cardiopulmonary disease. Electronically Signed   By: Helyn NumbersAshesh  Parikh MD   On: 07/13/2020 04:02   CT Angio Chest PE W and/or Wo Contrast  Result Date: 07/13/2020 CLINICAL DATA:  Chest pain EXAM: CT ANGIOGRAPHY CHEST WITH CONTRAST TECHNIQUE: Multidetector CT imaging of the chest was performed using the standard protocol during bolus administration of intravenous contrast. Multiplanar CT image reconstructions and MIPs were obtained to evaluate the vascular anatomy. CONTRAST:  100mL OMNIPAQUE IOHEXOL 350 MG/ML SOLN COMPARISON:  None. FINDINGS: Cardiovascular: Bolus opacification of the pulmonary arterial tree is suboptimal and the examination is adequate only for exclusion of central and lobar intraluminal filling defects, of which there are none. The segmental and subsegmental pulmonary arteries are not well opacified to definitively exclude the presence of small  pulmonary embolism. The central pulmonary arteries are of normal caliber. Cardiac size is within normal limits. No significant coronary artery calcification. No pericardial  effusion. The thoracic aorta is unremarkable. Mediastinum/Nodes: No enlarged mediastinal, hilar, or axillary lymph nodes. Thyroid gland, trachea, and esophagus demonstrate no significant findings. Lungs/Pleura: Nodular infiltrate is seen within the right upper lobe and, to a lesser extent within the left upper lobe and lower lobes, left greater than right. There is associated bronchial wall thickening in keeping with airway inflammation. Together, the findings are most in keeping with atypical infection in the acute setting. No central obstructing lesion. No pneumothorax or pleural effusion. Upper Abdomen: No acute abnormality within the visualized upper abdomen Musculoskeletal: No acute bone abnormality. No lytic or blastic bone lesion. Review of the MIP images confirms the above findings. IMPRESSION: Suboptimal opacification of the a pulmonary arterial tree. No evidence of large central pulmonary embolism. The segmental and subsegmental pulmonary arteries are not well assessed. Multifocal pulmonary infiltrates and associated airway inflammation most in keeping with acute atypical infection. Electronically Signed   By: Helyn Numbers MD   On: 07/13/2020 05:20    Procedures Procedures   Medications Ordered in ED Medications  acetaminophen (TYLENOL) tablet 1,000 mg (has no administration in time range)  azithromycin (ZITHROMAX) 500 mg in sodium chloride 0.9 % 250 mL IVPB (has no administration in time range)  cefTRIAXone (ROCEPHIN) 1 g in sodium chloride 0.9 % 100 mL IVPB (has no administration in time range)  albuterol (PROVENTIL) (2.5 MG/3ML) 0.083% nebulizer solution 2.5 mg (has no administration in time range)  lactated ringers infusion (has no administration in time range)  iohexol (OMNIPAQUE) 350 MG/ML injection 100 mL (100 mLs  Intravenous Contrast Given 07/13/20 0457)    ED Course  I have reviewed the triage vital signs and the nursing notes.  Pertinent labs & imaging results that were available during my care of the patient were reviewed by me and considered in my medical decision making (see chart for details).    Patient meets SIRS criteria and desaturates with ambulation.  Will need IV antibiotics.   Hunter Koch was evaluated in Emergency Department on 07/13/2020 for the symptoms described in the history of present illness. He was evaluated in the context of the global COVID-19 pandemic, which necessitated consideration that the patient might be at risk for infection with the SARS-CoV-2 virus that causes COVID-19. Institutional protocols and algorithms that pertain to the evaluation of patients at risk for COVID-19 are in a state of rapid change based on information released by regulatory bodies including the CDC and federal and state organizations. These policies and algorithms were followed during the patient's care in the ED.  Final Clinical Impression(s) / ED Diagnoses Final diagnoses:  Community acquired pneumonia, unspecified laterality  SIRS (systemic inflammatory response syndrome) (HCC)   Admit to medicine.     Ugonna Keirsey, MD 07/13/20 [phone]

## 2020-07-13 NOTE — ED Triage Notes (Signed)
Patient arrived with complaints of left sided chest pain and shortness of breath that started 5 hours ago. Reports some pain towards his left shoulder. Declines any NV but has some complaints of dizziness.

## 2020-07-14 ENCOUNTER — Other Ambulatory Visit: Payer: Self-pay

## 2020-07-14 DIAGNOSIS — J189 Pneumonia, unspecified organism: Secondary | ICD-10-CM | POA: Diagnosis not present

## 2020-07-14 LAB — COMPREHENSIVE METABOLIC PANEL
ALT: 19 U/L (ref 0–44)
AST: 22 U/L (ref 15–41)
Albumin: 4.3 g/dL (ref 3.5–5.0)
Alkaline Phosphatase: 60 U/L (ref 38–126)
Anion gap: 8 (ref 5–15)
BUN: 6 mg/dL (ref 6–20)
CO2: 25 mmol/L (ref 22–32)
Calcium: 9.1 mg/dL (ref 8.9–10.3)
Chloride: 104 mmol/L (ref 98–111)
Creatinine, Ser: 0.84 mg/dL (ref 0.61–1.24)
GFR, Estimated: >60 mL/min (ref >60)
Glucose, Bld: 102 mg/dL — ABNORMAL HIGH (ref 70–99)
Potassium: 3.8 mmol/L (ref 3.5–5.1)
Sodium: 137 mmol/L (ref 135–145)
Total Bilirubin: 1.6 mg/dL — ABNORMAL HIGH (ref 0.3–1.2)
Total Protein: 8.1 g/dL (ref 6.5–8.1)

## 2020-07-14 LAB — CBC
HCT: 47.1 % (ref 39.0–52.0)
Hemoglobin: 15 g/dL (ref 13.0–17.0)
MCH: 27.7 pg (ref 26.0–34.0)
MCHC: 31.8 g/dL (ref 30.0–36.0)
MCV: 86.9 fL (ref 80.0–100.0)
Platelets: 249 10*3/uL (ref 150–400)
RBC: 5.42 MIL/uL (ref 4.22–5.81)
RDW: 11.9 % (ref 11.5–15.5)
WBC: 10.1 10*3/uL (ref 4.0–10.5)
nRBC: 0 % (ref 0.0–0.2)

## 2020-07-14 LAB — LEGIONELLA PNEUMOPHILA SEROGP 1 UR AG: L. pneumophila Serogp 1 Ur Ag: NEGATIVE

## 2020-07-14 LAB — STREP PNEUMONIAE URINARY ANTIGEN: Strep Pneumo Urinary Antigen: NEGATIVE

## 2020-07-14 MED ORDER — AMOXICILLIN 500 MG PO TABS: 1000.0000 mg | ORAL_TABLET | Freq: Three times a day (TID) | ORAL | 0 refills | Status: AC

## 2020-07-14 MED ORDER — AZITHROMYCIN 250 MG PO TABS: 250.0000 mg | ORAL_TABLET | Freq: Every day | ORAL | 0 refills | Status: DC

## 2020-07-14 NOTE — Discharge Summary (Signed)
Physician Discharge Summary  Hunter Koch HKV:[id number] DOB: 1985/09/08 DOA: 07/13/2020  PCP: Harvie Heck, MD  Admit date: 07/13/2020 Discharge date: 07/14/2020  Admitted From: Home Disposition: Home   Recommendations for Outpatient Follow-up:  1. Follow up with PCP to continue HTN management. 2. Smoking cessation counseling advised  Home Health: None Equipment/Devices: None Discharge Condition: Stable CODE STATUS: Full Diet recommendation: Heart healthy  Brief/Interim Summary: Hunter Koch is a 35 y.o. male with a history of HTN and tobacco use who presented to the ED 3/31 with progressive shortness of breath with cough, chest tightness, and fever. In the ED, WBC 15.7k, covid testing was negative, ECG without ischemic changes, troponin negative, BNP wnl. CTA chest was performed revealing multifocal atypical infiltrates consistent with atypical pneumonia. He was reportedly desaturating with ambulation, so admission was requested. After IV antibiotics were given the patient's symptoms improved significantly and he has no dyspnea or chest pain or hypoxia. We will continue CAP abx and recommend PCP follow up for elevated blood pressures.   Discharge Diagnoses:  Active Problems:   Multifocal pneumonia  Multifocal pneumonia: Improved symptoms. Continue abx with amoxicillin, azithromycin. If symptoms return, specifically hypoxia and/or chest pain, could consider repeat CTA chest as the first study was suboptimal but showed no PE.   HTN: Continue diltiazem, consider thiazide. Defer to PCP   Hypokalemia: Supplemented  Tobacco use: Cessation counseling was provided.   Obesity: Body mass index is 33.38 kg/m.   Discharge Instructions Discharge Instructions    Discharge instructions   Complete by: As directed    You have evidence of atypical pneumonia which can be treated with antibiotics (amoxicillin three times daily and azithromycin once daily) which were sent [facility]. Follow up with your primary doctor in the next 1-2 weeks to discuss blood pressure management, or seek medical attention sooner if you notice worsened chest pain or trouble breathing.     Allergies as of 07/14/2020      Reactions   Lisinopril    Other reaction(s): Cough      Medication List    TAKE these medications   amoxicillin 500 MG tablet Commonly known as: AMOXIL Take 2 tablets (1,000 mg total) by mouth 3 (three) times daily for 6 days.   azithromycin 250 MG tablet Commonly known as: Zithromax Z-Pak Take 1 tablet (250 mg total) by mouth daily.   diltiazem 240 MG 24 hr capsule Commonly known as: CARDIZEM CD Take 240 mg by mouth daily.       Follow-up Information    Harvie Heck, MD Follow up.   Specialty: Family Medicine Contact information: [address] [place] [address] [place] [address] [phone]              Allergies  Allergen Reactions  . Lisinopril     Other reaction(s): Cough    Consultations:  None  Procedures/Studies: DG Chest 2 View  Result Date: 07/13/2020 CLINICAL DATA:  Chest pain, dyspnea EXAM: CHEST - 2 VIEW COMPARISON:  07/15/2017 FINDINGS: The heart size and mediastinal contours are within normal limits. Both lungs are clear. The visualized skeletal structures are unremarkable. IMPRESSION: No active cardiopulmonary disease. Electronically Signed   By: Helyn Numbers MD   On: 07/13/2020 04:02   CT Angio Chest PE W and/or Wo Contrast  Result Date: 07/13/2020 CLINICAL DATA:  Chest pain EXAM: CT ANGIOGRAPHY CHEST WITH CONTRAST TECHNIQUE: Multidetector CT imaging of the chest was performed using the standard protocol during bolus administration of  intravenous contrast. Multiplanar CT image reconstructions and MIPs were obtained to evaluate the vascular anatomy. CONTRAST:  OMNIPAQUE IOHEXOL 350 MG/ML SOLN COMPARISON:  None. FINDINGS: Cardiovascular: Bolus opacification of the pulmonary arterial tree is  suboptimal and the examination is adequate only for exclusion of central and lobar intraluminal filling defects, of which there are none. The segmental and subsegmental pulmonary arteries are not well opacified to definitively exclude the presence of small pulmonary embolism. The central pulmonary arteries are of normal caliber. Cardiac size is within normal limits. No significant coronary artery calcification. No pericardial effusion. The thoracic aorta is unremarkable. Mediastinum/Nodes: No enlarged mediastinal, hilar, or axillary lymph nodes. Thyroid gland, trachea, and esophagus demonstrate no significant findings. Lungs/Pleura: Nodular infiltrate is seen within the right upper lobe and, to a lesser extent within the left upper lobe and lower lobes, left greater than right. There is associated bronchial wall thickening in keeping with airway inflammation. Together, the findings are most in keeping with atypical infection in the acute setting. No central obstructing lesion. No pneumothorax or pleural effusion. Upper Abdomen: No acute abnormality within the visualized upper abdomen Musculoskeletal: No acute bone abnormality. No lytic or blastic bone lesion. Review of the MIP images confirms the above findings. IMPRESSION: Suboptimal opacification of the a pulmonary arterial tree. No evidence of large central pulmonary embolism. The segmental and subsegmental pulmonary arteries are not well assessed. Multifocal pulmonary infiltrates and associated airway inflammation most in keeping with acute atypical infection. Electronically Signed   By: Helyn Numbers MD   On: 07/13/2020 05:20     Subjective: Feels well. No chest discomfort or dyspnea when walking or at rest. No palpitations. No leg swelling or orthopnea or wheezing. Still with nonproductive cough that is bothersome.   Discharge Exam: Vitals:   07/14/20 0319 07/14/20 0823  BP: (!) 168/105   Pulse: 77   Resp: 17   Temp: 98.1 F (36.7 C)   SpO2:  97% 97%   General: Pt is alert, awake, not in acute distress Cardiovascular: RRR, S1/S2 +, no rubs, no gallops Respiratory: CTA bilaterally, no wheezing, no rhonchi Abdominal: Soft, NT, ND, bowel sounds + Extremities: No edema, no cyanosis  Labs: BNP (last 3 results) Recent Labs    07/13/20 0432  BNP 34.4   Basic Metabolic Panel: Recent Labs  Lab 07/13/20 0423 07/13/20 0432 07/13/20 1047 07/14/20 0313  NA 135 139  --  137  K 3.4* 3.5  --  3.8  CL 99 100  --  104  CO2 26  --   --  25  GLUCOSE 102* 99  --  102*  BUN 10 9  --  6  CREATININE 1.13 1.10 1.00 0.84  CALCIUM 8.3*  --   --  9.1  MG  --   --  2.0  --    Liver Function Tests: Recent Labs  Lab 07/14/20 0313  AST 22  ALT 19  ALKPHOS 60  BILITOT 1.6*  PROT 8.1  ALBUMIN 4.3   No results for input(s): LIPASE, AMYLASE in the last 168 hours. No results for input(s): AMMONIA in the last 168 hours. CBC: Recent Labs  Lab 07/13/20 0423 07/13/20 0432 07/14/20 0313  WBC 15.7*  --  10.1  HGB 13.7 13.9 15.0  HCT 41.8 41.0 47.1  MCV 86.0  --  86.9  PLT 214  --  249   Cardiac Enzymes: No results for input(s): CKTOTAL, CKMB, CKMBINDEX, TROPONINI in the last 168 hours. BNP: Invalid input(s): POCBNP  CBG: No results for input(s): GLUCAP in the last 168 hours. D-Dimer No results for input(s): DDIMER in the last 72 hours. Hgb A1c No results for input(s): HGBA1C in the last 72 hours. Lipid Profile No results for input(s): CHOL, HDL, LDLCALC, TRIG, CHOLHDL, LDLDIRECT in the last 72 hours. Thyroid function studies No results for input(s): TSH, T4TOTAL, T3FREE, THYROIDAB in the last 72 hours.  Invalid input(s): FREET3 Anemia work up No results for input(s): VITAMINB12, FOLATE, FERRITIN, TIBC, IRON, RETICCTPCT in the last 72 hours. Urinalysis    Component Value Date/Time   BILIRUBINUR negative 10/27/2015 0939   KETONESUR negative 10/27/2015 0939   PROTEINUR negative 10/27/2015 0939   UROBILINOGEN 1.0  10/27/2015 0939   NITRITE Negative 10/27/2015 0939   LEUKOCYTESUR Negative 10/27/2015 0939    Microbiology Recent Results (from the past 240 hour(s))  Resp Panel by RT-PCR (Flu A&B, Covid) Nasopharyngeal Swab     Status: None   Collection Time: 07/13/20  4:32 AM   Specimen: Nasopharyngeal Swab; Nasopharyngeal(NP) swabs in vial transport medium  Result Value Ref Range Status   SARS Coronavirus 2 by RT PCR NEGATIVE NEGATIVE Final    Comment: (NOTE) SARS-CoV-2 target nucleic acids are NOT DETECTED.  The SARS-CoV-2 RNA is generally detectable in upper respiratory specimens during the acute phase of infection. The lowest concentration of SARS-CoV-2 viral copies this assay can detect is 138 copies/mL. A negative result does not preclude SARS-Cov-2 infection and should not be used as the sole basis for treatment or other patient management decisions. A negative result may occur with  improper specimen collection/handling, submission of specimen other than nasopharyngeal swab, presence of viral mutation(s) within the areas targeted by this assay, and inadequate number of viral copies(<138 copies/mL). A negative result must be combined with clinical observations, patient history, and epidemiological information. The expected result is Negative.  Fact Sheet for Patients:  BloggerCourse.comhttps://www.fda.gov/media/152166/download  Fact Sheet for Healthcare Providers:  SeriousBroker.ithttps://www.fda.gov/media/152162/download  This test is no t yet approved or cleared by the [place] FDA and  has been authorized for detection and/or diagnosis of SARS-CoV-2 by FDA under an Emergency Use Authorization (EUA). This EUA will remain  in effect (meaning this test can be used) for the duration of the COVID-19 declaration under Section 564(b)(1) of the Act, 21 U.S.C.section 360bbb-3(b)(1), unless the authorization is terminated  or revoked sooner.       Influenza A by PCR NEGATIVE NEGATIVE Final   Influenza B by PCR  NEGATIVE NEGATIVE Final    Comment: (NOTE) The Xpert Xpress SARS-CoV-2/FLU/RSV plus assay is intended as an aid in the diagnosis of influenza from Nasopharyngeal swab specimens and should not be used as a sole basis for treatment. Nasal washings and aspirates are unacceptable for Xpert Xpress SARS-CoV-2/FLU/RSV testing.  Fact Sheet for Patients: BloggerCourse.comhttps://www.fda.gov/media/152166/download  Fact Sheet for Healthcare Providers: SeriousBroker.ithttps://www.fda.gov/media/152162/download  This test is not yet approved or cleared by the [place] FDA and has been authorized for detection and/or diagnosis of SARS-CoV-2 by FDA under an Emergency Use Authorization (EUA). This EUA will remain in effect (meaning this test can be used) for the duration of the COVID-19 declaration under Section 564(b)(1) of the Act, 21 U.S.C. section 360bbb-3(b)(1), unless the authorization is terminated or revoked.  Performed at [facility], [address] [place], [place] [id number]   Culture, blood (single)     Status: None (Preliminary result)   Collection Time: 07/13/20  6:04 AM   Specimen: BLOOD  Result Value Ref Range Status  Specimen Description   Final    BLOOD LEFT ANTECUBITAL Performed at [facility], [address] [place], [place] [address]    Special Requests   Final    BOTTLES DRAWN AEROBIC AND ANAEROBIC Blood Culture results may not be optimal due to an inadequate volume of blood received in culture bottles Performed at [facility], [address], [place], [place] [address]    Culture   Final    NO GROWTH < 12 HOURS Performed at [facility], [address], [place], [place] [address]    Report Status PENDING  Incomplete    Time coordinating discharge: Approximately 40 minutes  Tyrone Nine, MD  [facility] 07/14/2020, 9:59 AM

## 2020-07-14 NOTE — Progress Notes (Signed)
Pt stated that he had childhood asthma and the Pt still smokes.

## 2020-07-14 NOTE — Progress Notes (Signed)
Tele called and reported rhythm change from NSR to 1st degree heart block to 2nd degree heart block type 2. MD notified and ECG obtained. ECG shows NSR.

## 2020-07-18 LAB — CULTURE, BLOOD (SINGLE): Culture: NO GROWTH

## 2020-08-28 ENCOUNTER — Other Ambulatory Visit: Payer: Self-pay

## 2020-08-28 ENCOUNTER — Emergency Department (HOSPITAL_BASED_OUTPATIENT_CLINIC_OR_DEPARTMENT_OTHER): Payer: Managed Care, Other (non HMO) | Admitting: Radiology

## 2020-08-28 ENCOUNTER — Emergency Department (HOSPITAL_BASED_OUTPATIENT_CLINIC_OR_DEPARTMENT_OTHER)
Admission: EM | Admit: 2020-08-28 | Discharge: 2020-08-28 | Disposition: A | Discharge status: Home / Self Care | Payer: Managed Care, Other (non HMO) | Attending: Emergency Medicine | Admitting: Emergency Medicine

## 2020-08-28 ENCOUNTER — Encounter (HOSPITAL_BASED_OUTPATIENT_CLINIC_OR_DEPARTMENT_OTHER): Payer: Self-pay

## 2020-08-28 DIAGNOSIS — L089 Local infection of the skin and subcutaneous tissue, unspecified: Secondary | ICD-10-CM | POA: Diagnosis not present

## 2020-08-28 DIAGNOSIS — F17210 Nicotine dependence, cigarettes, uncomplicated: Secondary | ICD-10-CM | POA: Diagnosis not present

## 2020-08-28 DIAGNOSIS — Z79899 Other long term (current) drug therapy: Secondary | ICD-10-CM | POA: Insufficient documentation

## 2020-08-28 DIAGNOSIS — M79675 Pain in left toe(s): Secondary | ICD-10-CM | POA: Diagnosis present

## 2020-08-28 DIAGNOSIS — I10 Essential (primary) hypertension: Secondary | ICD-10-CM | POA: Insufficient documentation

## 2020-08-28 MED ORDER — ACETAMINOPHEN 325 MG PO TABS: 650.0000 mg | ORAL_TABLET | Freq: Four times a day (QID) | ORAL | 0 refills | Status: DC | PRN

## 2020-08-28 MED ORDER — CEPHALEXIN 250 MG PO CAPS
500.0000 mg | ORAL_CAPSULE | Freq: Once | ORAL | Status: AC
  Administered 2020-08-28: 500 mg via ORAL
  Filled 2020-08-28: qty 2

## 2020-08-28 MED ORDER — IBUPROFEN 600 MG PO TABS: 600.0000 mg | ORAL_TABLET | Freq: Four times a day (QID) | ORAL | 0 refills | Status: DC | PRN

## 2020-08-28 MED ORDER — CEPHALEXIN 500 MG PO CAPS: 500.0000 mg | ORAL_CAPSULE | Freq: Two times a day (BID) | ORAL | 0 refills | Status: AC

## 2020-08-28 MED ORDER — IBUPROFEN 800 MG PO TABS
800.0000 mg | ORAL_TABLET | Freq: Once | ORAL | Status: AC
  Administered 2020-08-28: 800 mg via ORAL
  Filled 2020-08-28: qty 1

## 2020-08-28 MED ORDER — ACETAMINOPHEN 325 MG PO TABS
650.0000 mg | ORAL_TABLET | Freq: Once | ORAL | Status: AC
  Administered 2020-08-28: 650 mg via ORAL
  Filled 2020-08-28: qty 2

## 2020-08-28 NOTE — ED Provider Notes (Signed)
[facility] EMERGENCY DEPT Provider Note   CSN: [id number] Arrival date & time: 08/28/20  1124     History Chief Complaint  Patient presents with  . Toe Pain    Hunter Koch is a 35 y.o. male presented to emergency department with pain in his left great toe.  The patient reports he works in an [employer].  He noted that he had developed a pressure blister on the top of his left great toe, over the joint, about a week ago.  He feels like the blister may have popped 2 days ago.  He has since had worsening pain on the upper surface of his left toe.  He has never had this issue before.  He denies any history of gout.  He is not diabetic.  He reports no drug allergies aside from lisinopril.  HPI     Past Medical History:  Diagnosis Date  . Hypertension   . No significant past medical history     Patient Active Problem List   Diagnosis Date Noted  . Multifocal pneumonia 07/13/2020  . No significant past medical history     Past Surgical History:  Procedure Laterality Date  . ABDOMINAL SURGERY         Family History  Problem Relation Age of Onset  . Hypertension Mother   . Diabetes Father     Social History   Tobacco Use  . Smoking status: Current Some Day Smoker    Packs/day: 0.50    Types: Cigarettes  . Smokeless tobacco: Never Used  Vaping Use  . Vaping Use: Never used  Substance Use Topics  . Alcohol use: Yes    Alcohol/week: 7.0 standard drinks    Types: 7 Cans of beer per week    Comment: often  . Drug use: Yes    Types: Marijuana    Home Medications Prior to Admission medications   Medication Sig Start Date End Date Taking? Authorizing Provider  acetaminophen (TYLENOL) 325 MG tablet Take 2 tablets (650 mg total) by mouth every 6 (six) hours as needed for up to 30 doses for mild pain or moderate pain. 08/28/20  Yes Ed Mandich, Kermit Balo, MD  cephALEXin (KEFLEX) 500 MG capsule Take 1 capsule (500 mg  total) by mouth 2 (two) times daily for 10 days. 08/28/20 09/07/20 Yes Zaydah Nawabi, Kermit Balo, MD  ibuprofen (ADVIL) 600 MG tablet Take 1 tablet (600 mg total) by mouth every 6 (six) hours as needed for up to 30 doses. 08/28/20  Yes Jarold Macomber, Kermit Balo, MD  azithromycin (ZITHROMAX Z-PAK) 250 MG tablet Take 1 tablet (250 mg total) by mouth daily. 07/14/20   Tyrone Nine, MD  chlorthalidone (HYGROTON) 25 MG tablet Take 25 mg by mouth daily. 07/21/20   [provider]  diltiazem (CARDIZEM CD) 240 MG 24 hr capsule Take 240 mg by mouth daily. 07/12/20   [provider]    Allergies    Lisinopril  Review of Systems   Review of Systems  Constitutional: Negative for chills and fever.  Respiratory: Negative for cough and shortness of breath.   Cardiovascular: Negative for chest pain and palpitations.  Gastrointestinal: Negative for abdominal pain and vomiting.  Musculoskeletal: Positive for arthralgias, gait problem and myalgias.  Skin: Positive for rash and wound. Negative for color change.  Neurological: Negative for weakness and numbness.  All other systems reviewed and are negative.   Physical Exam Updated Vital Signs BP 133/85   Pulse  77   Temp 97.7 F (36.5 C) (Oral)   Resp 20   Ht 6\' 1"  (1.854 m)   Wt 117.9 kg   SpO2 100%   BMI 34.30 kg/m   Physical Exam Constitutional:      General: He is not in acute distress. HENT:     Head: Normocephalic and atraumatic.  Eyes:     Conjunctiva/sclera: Conjunctivae normal.     Pupils: Pupils are equal, round, and reactive to light.  Cardiovascular:     Rate and Rhythm: Normal rate and regular rhythm.     Pulses: Normal pulses.  Pulmonary:     Effort: Pulmonary effort is normal. No respiratory distress.  Musculoskeletal:     Comments: Left large toe with 1 cm circular lesion/ulceration, scabbed, overlying distal joint. Tenderness of proximal nailbed of great toe without significant fluctuation No purulent drainage  Skin:     General: Skin is warm and dry.  Neurological:     General: No focal deficit present.     Mental Status: He is alert. Mental status is at baseline.  Psychiatric:        Mood and Affect: Mood normal.        Behavior: Behavior normal.     ED Results / Procedures / Treatments   Labs (all labs ordered are listed, but only abnormal results are displayed) Labs Reviewed - No data to display  EKG None  Radiology DG Toe Great Left  Result Date: 08/28/2020 CLINICAL DATA:  Ulceration to top of left great toe interphalangeal joint, worsening toe pain, assess for osteomyelitis or gout EXAM: LEFT GREAT TOE COMPARISON:  None. FINDINGS: No fracture or dislocation of the left great toe. No evidence of bony erosion. There is very severe first metatarsophalangeal arthrosis without significant bunion deformity. Soft tissue edema about the digit. IMPRESSION: 1.  No fracture or dislocation of the left great toe. 2. 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 3.  No radiographic evidence of bony erosion. 4.  Soft tissue edema about the digit. Electronically Signed   By: 08/30/2020 M.D.   On: 08/28/2020 13:03    Procedures Procedures   Medications Ordered in ED Medications  ibuprofen (ADVIL) tablet 800 mg (800 mg Oral Given 08/28/20 1225)  acetaminophen (TYLENOL) tablet 650 mg (650 mg Oral Given 08/28/20 1225)  cephALEXin (KEFLEX) capsule 500 mg (500 mg Oral Given 08/28/20 1225)    ED Course  I have reviewed the triage vital signs and the nursing notes.  Pertinent labs & imaging results that were available during my care of the patient were reviewed by me and considered in my medical decision making (see chart for details).  Left toe pain  DDx includes infection/cellulitis vs gout vs early paronychia vs other  He is not diabetic. Xray reviewed - no evidence of acute osteomyelitis. Lower suspicion for septic joint  At this point gout seems less likely  given that there is a culprit skin lesion directly overlying his source of pain.  PO pain medications given.  Discussed Keflex BID x 10 days, f/u with podiatry if he fails treatment or develops subsequent paronchyia here.  I advised warm soaks at home; otherwise keeping foot dry.  Advised finding more comfortable/better work boots that don't put pressure on his toes.  He verbalized understanding.     Final Clinical Impression(s) / ED Diagnoses Final diagnoses:  Toe infection    Rx / DC Orders ED Discharge Orders  Ordered    cephALEXin (KEFLEX) 500 MG capsule  2 times daily        08/28/20 1328    ibuprofen (ADVIL) 600 MG tablet  Every 6 hours PRN        08/28/20 1328    acetaminophen (TYLENOL) 325 MG tablet  Every 6 hours PRN        08/28/20 1328           Spencer Cardinal, Kermit Balo, MD 08/28/20 1755

## 2020-08-28 NOTE — Discharge Instructions (Signed)
You may have a skin infection of your toe from the burst blister on your toe.  I put you on antibiotic which I want you to take for the next 10 days, as prescribed.  I also try to soak your toe in warm water for 10 minutes at a time, three times per day.  When you are not soaking your toe, and when you to keep it clean and dry - open to air.   I wrote you a work note.  You should find a pair of shoes that do not put pressure on your toes.   Schedule a follow-up appointment with a podiatrist (foot doctor) at the number above for later this week.

## 2020-08-28 NOTE — ED Triage Notes (Signed)
Pain and swelling to 1st digit on L foot. Started last week with gradual worsening.

## 2022-01-29 ENCOUNTER — Encounter (HOSPITAL_COMMUNITY): Payer: Self-pay | Admitting: *Deleted

## 2022-01-29 ENCOUNTER — Ambulatory Visit (HOSPITAL_COMMUNITY)
Admission: EM | Admit: 2022-01-29 | Discharge: 2022-01-29 | Disposition: A | Discharge status: Home / Self Care | Payer: Managed Care, Other (non HMO)

## 2022-01-29 DIAGNOSIS — I10 Essential (primary) hypertension: Secondary | ICD-10-CM

## 2022-01-29 DIAGNOSIS — T7840XA Allergy, unspecified, initial encounter: Secondary | ICD-10-CM | POA: Diagnosis not present

## 2022-01-29 MED ORDER — PREDNISONE 10 MG (21) PO TBPK: ORAL_TABLET | Freq: Every day | ORAL | 0 refills | Status: AC

## 2022-01-29 MED ORDER — METHYLPREDNISOLONE SODIUM SUCC 125 MG IJ SOLR
INTRAMUSCULAR | Status: AC
  Filled 2022-01-29: qty 2

## 2022-01-29 MED ORDER — METHYLPREDNISOLONE SODIUM SUCC 125 MG IJ SOLR
125.0000 mg | Freq: Once | INTRAMUSCULAR | Status: AC
  Administered 2022-01-29: 125 mg via INTRAMUSCULAR

## 2022-01-29 MED ORDER — HYDROXYZINE HCL 25 MG PO TABS: 25.0000 mg | ORAL_TABLET | Freq: Four times a day (QID) | ORAL | 0 refills | Status: AC | PRN

## 2022-01-29 NOTE — ED Provider Notes (Signed)
[facility]    CSN: [id number] Arrival date & time: 01/29/22  0950      History   Chief Complaint Chief Complaint  Patient presents with   Rash    HPI Hunter Koch is a 36 y.o. male.   Pleasant 36yo male presents today due to three days of rash and intense pruritis. States sx started he believes on his R upper arm, and have progressed to involve his entire body, sparing hands, feet and face. No new foods or lotions. He works for a [employer], and states his company washes his clothes for him. He is uncertain if they used a new detergent or not. Rash affects all aspect of body in which work uniform touches. Pt has been using OTC benadryl and topical steroid cream without relief. Denies wheezing, SOB, throat swelling, dysphagia or any additional sx. Has hx of HTN, has not taken his BP medication yet this morning. Denies dizziness, HA, blurred vision, etc.   Rash   Past Medical History:  Diagnosis Date   Hypertension    No significant past medical history     Patient Active Problem List   Diagnosis Date Noted   Multifocal pneumonia 07/13/2020   No significant past medical history     Past Surgical History:  Procedure Laterality Date   ABDOMINAL SURGERY         Home Medications    Prior to Admission medications   Medication Sig Start Date End Date Taking? Authorizing Provider  diltiazem (CARDIZEM CD) 240 MG 24 hr capsule Take 240 mg by mouth daily. 07/12/20  Yes [provider]  hydrOXYzine (ATARAX) 25 MG tablet Take 1 tablet (25 mg total) by mouth every 6 (six) hours as needed for itching. Sedation precaution 01/29/22  Yes Konni Kesinger L, PA  predniSONE (STERAPRED UNI-PAK 21 TAB) 10 MG (21) TBPK tablet Take by mouth daily. Take 6 tabs by mouth daily  for 2 days, then 5 tabs for 2 days, then 4 tabs for 2 days, then 3 tabs for 2 days, 2 tabs for 2 days, then 1 tab by mouth daily for 2 days 01/29/22  Yes Romell Cavanah L, PA  sildenafil  (VIAGRA) 100 MG tablet Take by mouth. 06/26/21  Yes [provider]    Family History Family History  Problem Relation Age of Onset   Hypertension Mother    Diabetes Father     Social History Social History   Tobacco Use   Smoking status: Some Days    Packs/day: 0.50    Types: Cigarettes   Smokeless tobacco: Never  Vaping Use   Vaping Use: Never used  Substance Use Topics   Alcohol use: Yes    Alcohol/week: 7.0 standard drinks of alcohol    Types: 7 Cans of beer per week    Comment: often   Drug use: Yes    Types: Marijuana     Allergies   Lisinopril   Review of Systems Review of Systems  Skin:  Positive for rash.  As per HPI   Physical Exam Triage Vital Signs ED Triage Vitals  Enc Vitals Group     BP 01/29/22 1046 (!) 163/106     Pulse Rate 01/29/22 1046 92     Resp 01/29/22 1046 18     Temp 01/29/22 1046 98.1 F (36.7 C)     Temp Source 01/29/22 1046 Oral     SpO2 01/29/22 1046 100 %     Weight --  Height --      Head Circumference --      Peak Flow --      Pain Score 01/29/22 1043 0     Pain Loc --      Pain Edu? --      Excl. in [place]? --    No data found.  Updated Vital Signs BP (!) 163/106 (BP Location: Right Arm)   Pulse 92   Temp 98.1 F (36.7 C) (Oral)   Resp 18   SpO2 100%   Visual Acuity Right Eye Distance:   Left Eye Distance:   Bilateral Distance:    Right Eye Near:   Left Eye Near:    Bilateral Near:     Physical Exam Vitals and nursing note reviewed.  Constitutional:      General: He is not in acute distress.    Appearance: Normal appearance. He is well-developed.     Comments: Generalized itching  HENT:     Head: Normocephalic and atraumatic.     Nose: No congestion or rhinorrhea.     Mouth/Throat:     Mouth: Mucous membranes are moist.     Pharynx: Oropharynx is clear. No oropharyngeal exudate or posterior oropharyngeal erythema.  Eyes:     General: No scleral icterus.       Right eye: No discharge.         Left eye: No discharge.     Extraocular Movements: Extraocular movements intact.     Conjunctiva/sclera: Conjunctivae normal.     Pupils: Pupils are equal, round, and reactive to light.  Cardiovascular:     Rate and Rhythm: Normal rate and regular rhythm.  Pulmonary:     Effort: Pulmonary effort is normal. No respiratory distress.     Breath sounds: Normal breath sounds. No stridor. No wheezing or rhonchi.  Abdominal:     Palpations: Abdomen is soft.     Tenderness: There is no abdominal tenderness.  Musculoskeletal:        General: No swelling.     Cervical back: Normal range of motion and neck supple.  Lymphadenopathy:     Cervical: No cervical adenopathy.  Skin:    General: Skin is warm and dry.     Capillary Refill: Capillary refill takes less than 2 seconds.     Coloration: Skin is not jaundiced.     Findings: Rash (generalized papular rash with excoriations across entire body, worse to R arm and torso) present. No bruising or lesion.  Neurological:     General: No focal deficit present.     Mental Status: He is alert and oriented to person, place, and time.     Sensory: No sensory deficit.     Gait: Gait normal.  Psychiatric:        Mood and Affect: Mood normal.      UC Treatments / Results  Labs (all labs ordered are listed, but only abnormal results are displayed) Labs Reviewed - No data to display  EKG   Radiology No results found.  Procedures Procedures (including critical care time)  Medications Ordered in UC Medications  methylPREDNISolone sodium succinate (SOLU-MEDROL) 125 mg/2 mL injection 125 mg (125 mg Intramuscular Given 01/29/22 1114)    Initial Impression / Assessment and Plan / UC Course  I have reviewed the triage vital signs and the nursing notes.  Pertinent labs & imaging results that were available during my care of the patient were reviewed by me and considered in my medical decision making (  see chart for details).      Allergic reaction -it is possible this is related to a new detergent as used at [employer] he works for.  Patient denies any new chemical exposures.  He is not having any systemic allergy symptoms, only cutaneous.  Given widespread nature, Solu-Medrol given in office today.  Will discharge home with 10-day taper of prednisone in addition to hydroxyzine as needed.  Return to clinic warning signs discussed with patient. Essential hypertension -patient with known history of this, has not taken his blood pressure medication yet this morning.  Patient also uncomfortable from itching.  Recommended he monitor at home, follow-up with PCP should BP remain elevated.   Final Clinical Impressions(s) / UC Diagnoses   Final diagnoses:  Allergic reaction, initial encounter  Essential hypertension     Discharge Instructions      You appear to have a cutaneous allergic reaction. You were given an injection of a steroid called Solu-Medrol in our clinic. Start taking the prednisone tomorrow.  Best to take this in the morning to prevent insomnia at night. You may start taking the hydroxyzine today as needed.  Take this every 6 hours as needed for itching.  Do not take Benadryl while taking hydroxyzine. Please take your blood pressure medication when you arrive home.  Please monitor your blood pressure and follow-up with your PCP should it remain elevated.     ED Prescriptions     Medication Sig Dispense Auth. Provider   predniSONE (STERAPRED UNI-PAK 21 TAB) 10 MG (21) TBPK tablet Take by mouth daily. Take 6 tabs by mouth daily  for 2 days, then 5 tabs for 2 days, then 4 tabs for 2 days, then 3 tabs for 2 days, 2 tabs for 2 days, then 1 tab by mouth daily for 2 days 42 tablet Jebadiah Imperato L, PA   hydrOXYzine (ATARAX) 25 MG tablet Take 1 tablet (25 mg total) by mouth every 6 (six) hours as needed for itching. Sedation precaution 20 tablet Kenzlei Runions L, PA      PDMP not reviewed this  encounter.   Maretta Bees, [place] 01/29/22 1140

## 2022-01-29 NOTE — Discharge Instructions (Signed)
You appear to have a cutaneous allergic reaction. You were given an injection of a steroid called Solu-Medrol in our clinic. Start taking the prednisone tomorrow.  Best to take this in the morning to prevent insomnia at night. You may start taking the hydroxyzine today as needed.  Take this every 6 hours as needed for itching.  Do not take Benadryl while taking hydroxyzine. Please take your blood pressure medication when you arrive home.  Please monitor your blood pressure and follow-up with your PCP should it remain elevated.

## 2022-01-29 NOTE — ED Triage Notes (Signed)
Pt states he has a rash all over his body x 3 days. He doesn't have any known allergies, and hasn't tried anything new. He is itching head to toe. He has tried benadryl, anti itch creams OTC without relief.

## 2022-06-05 IMAGING — CT CT ANGIO CHEST
2 of 6 series · 18 of 36 positions shown · IV contrast (OMNIPAQUE 350)
Comparison: None.

CLINICAL DATA: Chest pain

EXAM:
CT ANGIOGRAPHY CHEST WITH CONTRAST
TECHNIQUE: Multidetector CT imaging of the chest was performed using the
standard protocol during bolus administration of intravenous
contrast. Multiplanar CT image reconstructions and MIPs were
obtained to evaluate the vascular anatomy.
CONTRAST:  100mL OMNIPAQUE IOHEXOL 350 MG/ML SOLN

[Series 6: thins · axial · 0.67mm/px · z∈[+1516,+1778]mm · 17 of 296 slices shown]
[im 17/296  lung]
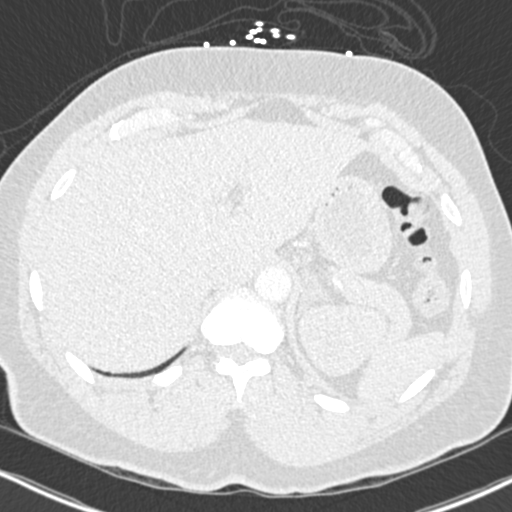
[im 33/296  mediastinal]
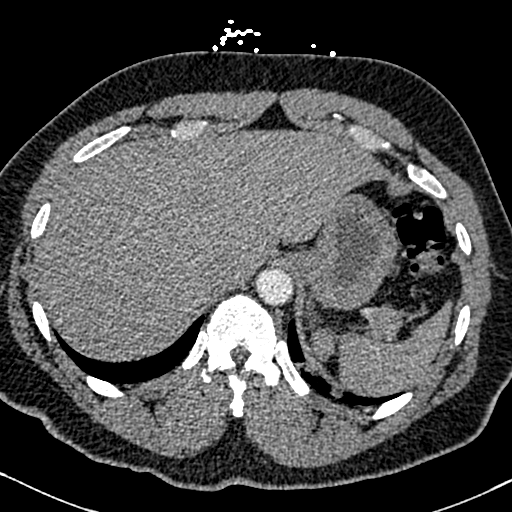
[im 50/296  lung]
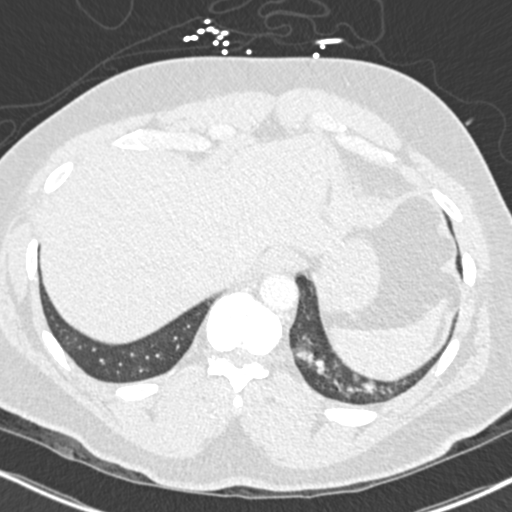
[im 66/296  mediastinal]
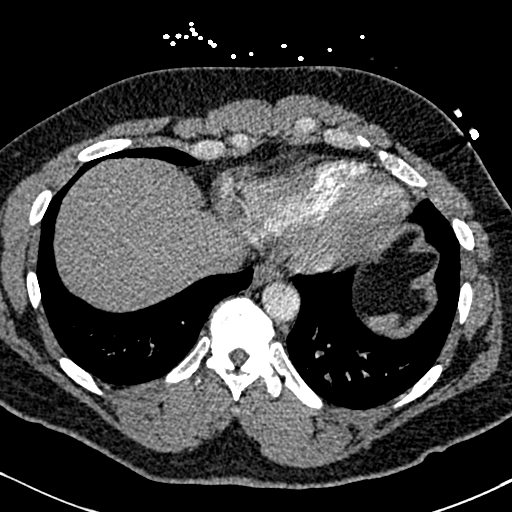
[im 82/296  lung]
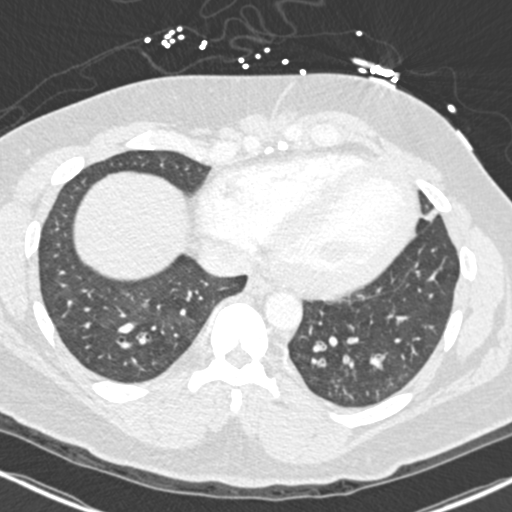
[im 99/296  mediastinal]
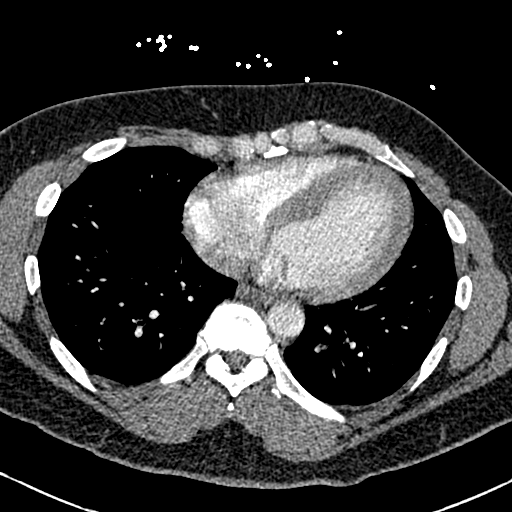
[im 115/296  lung]
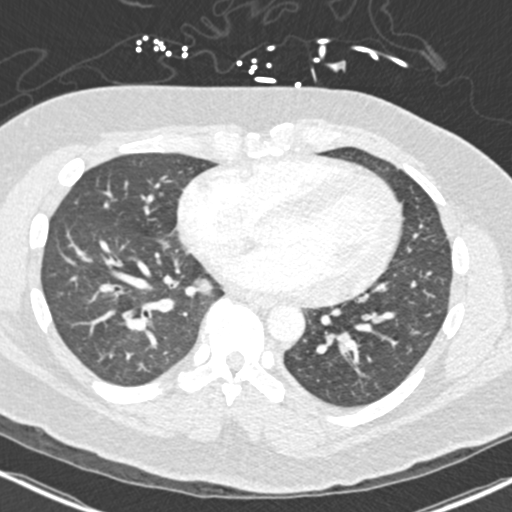
[im 132/296  mediastinal]
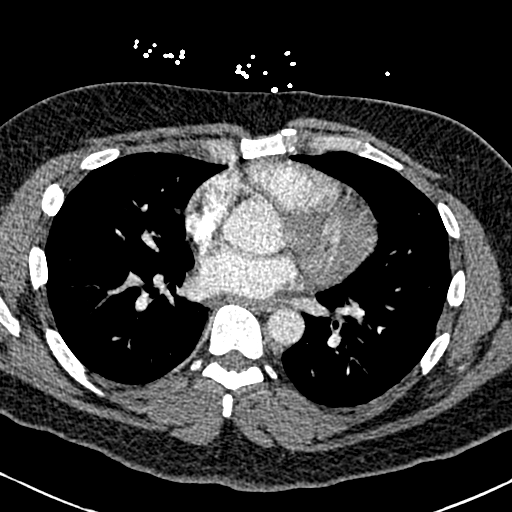
[im 148/296  lung]
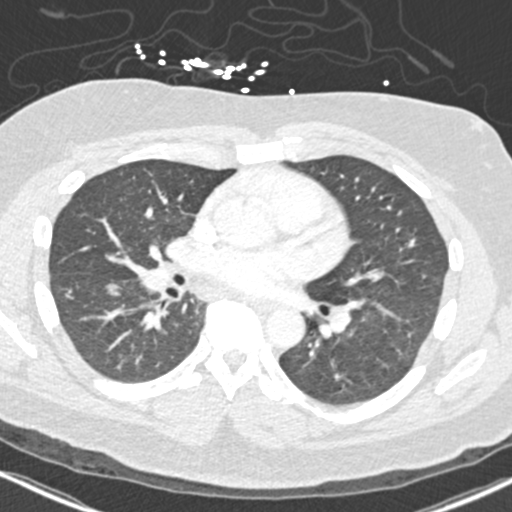
[im 164/296  mediastinal]
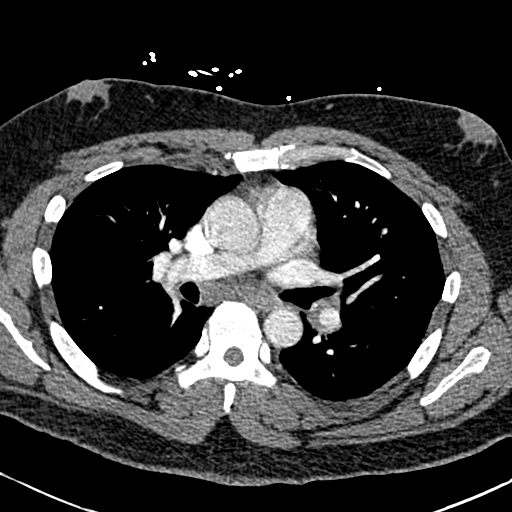
[im 181/296  lung]
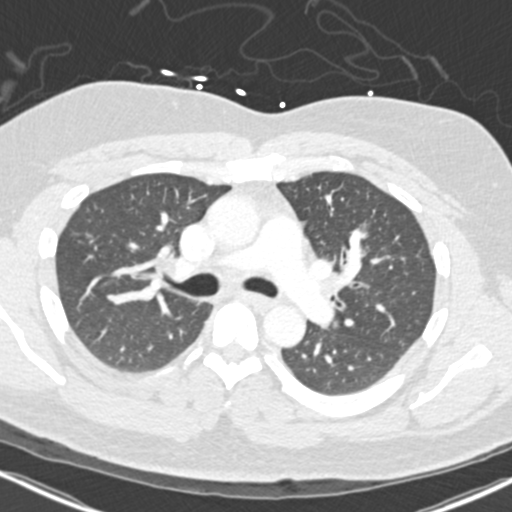
[im 197/296  mediastinal]
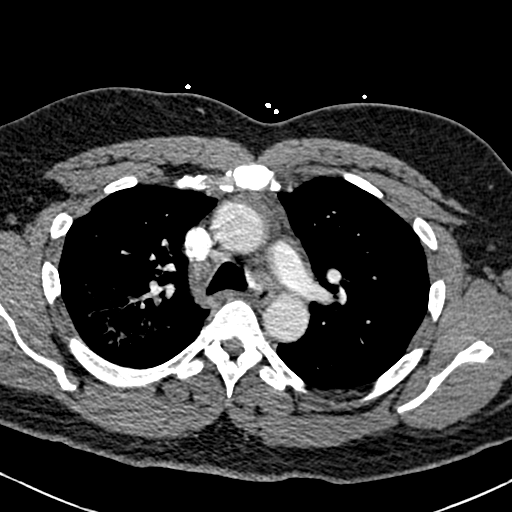
[im 214/296  lung]
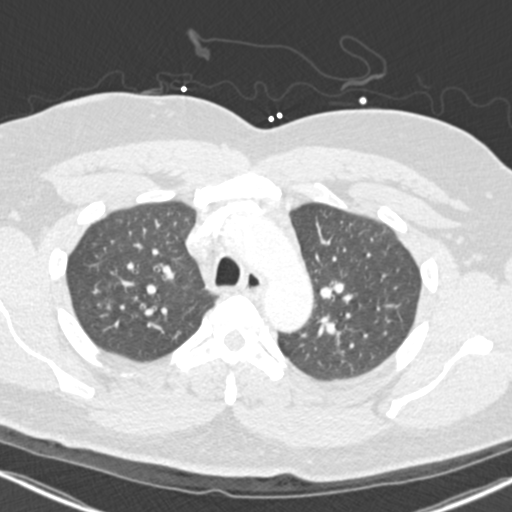
[im 230/296  mediastinal]
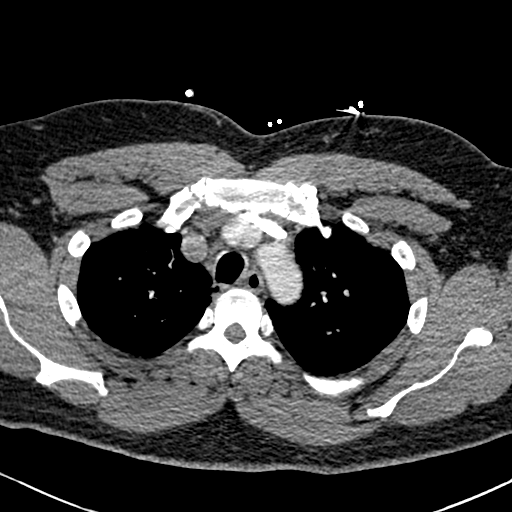
[im 246/296  lung]
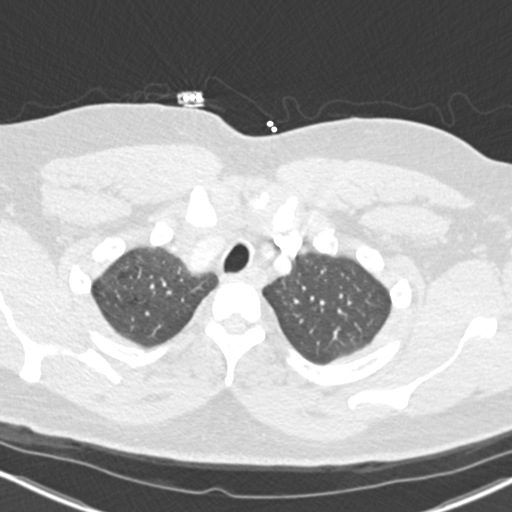
[im 263/296  mediastinal]
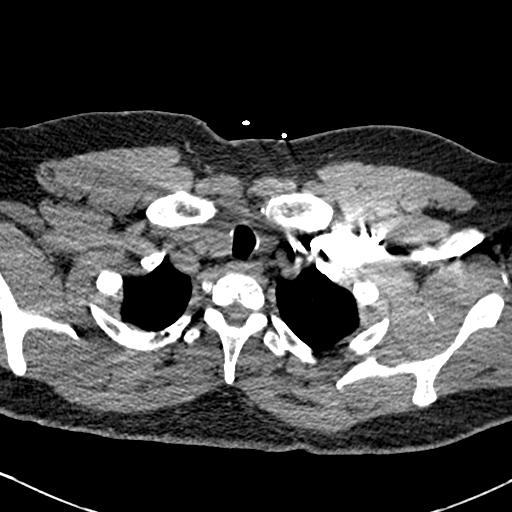
[im 279/296  lung]
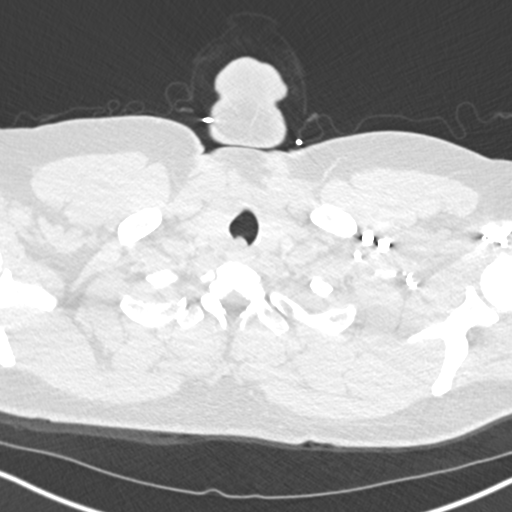

[Series 8: coronal mpr · coronal · 0.57mm/px · 1 of 140 slices shown]
[im 70/140  mediastinal]
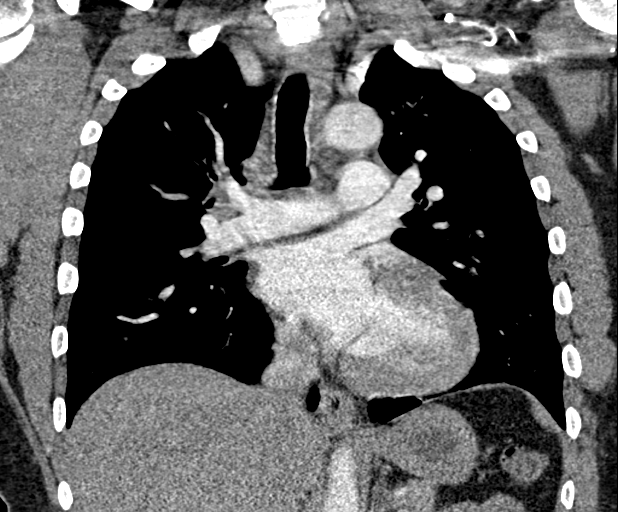

[18 of 36 positions shown; findings below may reference images not displayed]

FINDINGS: Cardiovascular: Bolus opacification of the pulmonary arterial tree
is suboptimal and the examination is adequate only for exclusion of
central and lobar intraluminal filling defects, of which there are
none. The segmental and subsegmental pulmonary arteries are not well
opacified to definitively exclude the presence of small pulmonary
embolism. The central pulmonary arteries are of normal caliber.
Cardiac size is within normal limits. No significant coronary artery
calcification. No pericardial effusion. The thoracic aorta is
unremarkable.

Mediastinum/Nodes: No enlarged mediastinal, hilar, or axillary lymph
nodes. Thyroid gland, trachea, and esophagus demonstrate no
significant findings.

Lungs/Pleura: Nodular infiltrate is seen within the right upper lobe
and, to a lesser extent within the left upper lobe and lower lobes,
left greater than right. There is associated bronchial wall
thickening in keeping with airway inflammation. Together, the
findings are most in keeping with atypical infection in the acute
setting. No central obstructing lesion. No pneumothorax or pleural
effusion.

Upper Abdomen: No acute abnormality within the visualized upper
abdomen

Musculoskeletal: No acute bone abnormality. No lytic or blastic bone
lesion.

Review of the MIP images confirms the above findings.
IMPRESSION: Suboptimal opacification of the a pulmonary arterial tree. No
evidence of large central pulmonary embolism. The segmental and
subsegmental pulmonary arteries are not well assessed.

Multifocal pulmonary infiltrates and associated airway inflammation
most in keeping with acute atypical infection.

## 2024-05-14 ENCOUNTER — Ambulatory Visit: Admitting: Family

## 2024-05-14 ENCOUNTER — Encounter: Payer: Self-pay | Admitting: Family

## 2024-05-14 ENCOUNTER — Ambulatory Visit: Payer: Self-pay | Admitting: Family

## 2024-05-14 VITALS — BP 155/116 | HR 78 | Ht 74.0 in | Wt 260.2 lb

## 2024-05-14 DIAGNOSIS — R0683 Snoring: Secondary | ICD-10-CM | POA: Diagnosis not present

## 2024-05-14 DIAGNOSIS — I10 Essential (primary) hypertension: Secondary | ICD-10-CM | POA: Diagnosis not present

## 2024-05-14 DIAGNOSIS — Z7689 Persons encountering health services in other specified circumstances: Secondary | ICD-10-CM

## 2024-05-14 DIAGNOSIS — F419 Anxiety disorder, unspecified: Secondary | ICD-10-CM

## 2024-05-14 DIAGNOSIS — L918 Other hypertrophic disorders of the skin: Secondary | ICD-10-CM

## 2024-05-14 DIAGNOSIS — F418 Other specified anxiety disorders: Secondary | ICD-10-CM | POA: Diagnosis not present

## 2024-05-14 MED ORDER — VALSARTAN 40 MG PO TABS: 40.0000 mg | ORAL_TABLET | Freq: Every day | ORAL | 0 refills | Status: AC

## 2024-05-14 MED ORDER — TRIAMCINOLONE ACETONIDE 0.025 % EX CREA: 1.0000 | TOPICAL_CREAM | Freq: Two times a day (BID) | CUTANEOUS | 2 refills | Status: AC

## 2024-05-14 NOTE — Progress Notes (Signed)
 "  Subjective:    Hunter Koch - 39 y.o. male MRN [id number]  Date of birth: 09-12-1985  HPI  Hunter Koch is to establish care.   Current issues and/or concerns: - History of high blood pressure. States he has not taken blood pressure medication in 1 year due to needing refills from his former primary provider. States he cannot recall the name of the blood pressure medication he was taking. States his home blood pressure readings are high. He does not limit salt intake. He does not consistently exercise outside of his normal routine. He does not complain of red flag symptoms such as but not limited to chest pain, shortness of breath, worst headache of life, nausea/vomiting.  - Anxiety depression. He denies thoughts of self-harm, suicidal ideations, homicidal ideations. - Left arm skin tag for months. Reports itching denies red flag symptoms.  - Snoring during sleep. States he stops breathing during sleep.    ROS per HPI     Health Maintenance:  Health Maintenance Due  Topic Date Due   DTaP/Tdap/Td (1 - Tdap) Never done   Pneumococcal Vaccine (1 of 2 - PCV) Never done   Hepatitis B Vaccines 19-59 Average Risk (1 of 3 - 19+ 3-dose series) Never done     Past Medical History: Patient Active Problem List   Diagnosis Date Noted   Multifocal pneumonia 07/13/2020   No significant past medical history       Social History   reports that he has been smoking cigarettes. He has never used smokeless tobacco. He reports current alcohol use of about 7.0 standard drinks of alcohol per week. He reports current drug use. Drug: Marijuana.   Family History  family history includes Diabetes in his father; Hypertension in his mother.   Medications: reviewed and updated   Objective:   Physical Exam BP (!) 155/116   Pulse 78   Ht 6' 2 (1.88 m)   Wt 260 lb 3.2 oz (118 kg)   BMI 33.41 kg/m   Physical Exam HENT:     Head: Normocephalic and atraumatic.     Nose: Nose normal.      Mouth/Throat:     Mouth: Mucous membranes are moist.     Pharynx: Oropharynx is clear.  Eyes:     Extraocular Movements: Extraocular movements intact.     Conjunctiva/sclera: Conjunctivae normal.     Pupils: Pupils are equal, round, and reactive to light.  Cardiovascular:     Rate and Rhythm: Normal rate and regular rhythm.     Pulses: Normal pulses.     Heart sounds: Normal heart sounds.  Pulmonary:     Effort: Pulmonary effort is normal.     Breath sounds: Normal breath sounds.  Musculoskeletal:        General: Normal range of motion.     Cervical back: Normal range of motion and neck supple.  Skin:    General: Skin is warm and dry.     Comments: Hyperpigmented skin tag left arm.   Neurological:     General: No focal deficit present.     Mental Status: He is alert and oriented to person, place, and time.  Psychiatric:        Mood and Affect: Mood normal.        Behavior: Behavior normal.        Assessment & Plan:  1. Encounter to establish care (Primary) - Patient presents today to establish care. During the interim follow-up with primary  provider as scheduled.  - Return for annual physical examination, labs, and health maintenance. Arrive fasting meaning having no food for at least 8 hours prior to appointment. You may have only water or black coffee. Please take scheduled medications as normal.  2. Primary hypertension - Blood pressure not at goal during today's visit. Patient asymptomatic without chest pressure, chest pain, palpitations, shortness of breath, worst headache of life, and any additional red flag symptoms. - Valsartan  as prescribed.  - Routine screening.  - Counseled on blood pressure goal of less than 130/80, low-sodium, DASH diet, medication compliance, and 150 minutes of moderate intensity exercise per week as tolerated. Counseled on medication adherence and adverse effects. - Follow-up with primary provider in 4 weeks or sooner if needed.  - valsartan   (DIOVAN ) 40 MG tablet; Take 1 tablet (40 mg total) by mouth daily.  Dispense: 90 tablet; Refill: 0 - Basic Metabolic Panel  3. Anxiety and depression - Patient denies thoughts of self-harm, suicidal ideations, homicidal ideations. - Patient declined pharmacological therapy.  - Patient declined referral to Psychiatry.  - Follow-up with primary provider as scheduled.   4. Skin tag - Triamcinolone  as prescribed. Counseled on medication adherence/adverse effects.  - Referral to Dermatology for evaluation/management.  - Follow-up with primary provider as scheduled.  - Ambulatory referral to Dermatology - triamcinolone  (KENALOG ) 0.025 % cream; Apply 1 Application topically 2 (two) times daily.  Dispense: 60 g; Refill: 2  5. Snoring - Routine screening.  - Referral to Sleep Studies for evaluation/management.  - [facility] Sleep Study; Future - Ambulatory referral to Sleep Studies    Patient was given clear instructions to go to Emergency Department or return to medical center if symptoms don't improve, worsen, or new problems develop.The patient verbalized understanding.  I discussed the assessment and treatment plan with the patient. The patient was provided an opportunity to ask questions and all were answered. The patient agreed with the plan and demonstrated an understanding of the instructions.   The patient was advised to call back or seek an in-person evaluation if the symptoms worsen or if the condition fails to improve as anticipated.    Greig Chute, NP 05/14/2024, 8:53 AM Primary Care at [facility]  "

## 2024-05-15 LAB — BASIC METABOLIC PANEL WITH GFR
BUN/Creatinine Ratio: 9 (ref 9–20)
BUN: 9 mg/dL (ref 6–24)
CO2: 22 mmol/L (ref 20–29)
Calcium: 8.7 mg/dL (ref 8.7–10.2)
Chloride: 103 mmol/L (ref 96–106)
Creatinine, Ser: 0.95 mg/dL (ref 0.76–1.27)
Glucose: 72 mg/dL (ref 70–99)
Potassium: 4.3 mmol/L (ref 3.5–5.2)
Sodium: 139 mmol/L (ref 134–144)
eGFR: 104 mL/min/{1.73_m2}

## 2024-05-18 ENCOUNTER — Ambulatory Visit: Payer: Self-pay | Admitting: Family

## 2024-06-11 ENCOUNTER — Ambulatory Visit: Payer: Self-pay | Admitting: Family
